# Patient Record
Sex: Male | Born: 1970 | Race: White | Hispanic: No | Marital: Single | Smoking: Never smoker
Health system: Southern US, Community
[De-identification: ages and names within clinical notes are randomized; demographics above are authoritative.]

## PROBLEM LIST (undated history)

## (undated) ENCOUNTER — Ambulatory Visit (HOSPITAL_COMMUNITY): Admission: EM | Payer: PRIVATE HEALTH INSURANCE | Source: Home / Self Care

## (undated) DIAGNOSIS — K219 Gastro-esophageal reflux disease without esophagitis: Secondary | ICD-10-CM

## (undated) HISTORY — PX: INGUINAL HERNIA REPAIR: SUR1180

## (undated) HISTORY — PX: OTHER SURGICAL HISTORY: SHX169

---

## 1999-05-27 ENCOUNTER — Encounter: Payer: Self-pay | Admitting: General Surgery

## 1999-05-27 ENCOUNTER — Inpatient Hospital Stay (HOSPITAL_COMMUNITY): Admission: EM | Admit: 1999-05-27 | Discharge: 1999-05-28 | Payer: Self-pay | Admitting: Emergency Medicine

## 2007-09-05 ENCOUNTER — Ambulatory Visit: Payer: Self-pay | Admitting: General Surgery

## 2009-02-23 ENCOUNTER — Emergency Department (HOSPITAL_BASED_OUTPATIENT_CLINIC_OR_DEPARTMENT_OTHER): Admission: EM | Admit: 2009-02-23 | Discharge: 2009-02-23 | Payer: Self-pay | Admitting: Emergency Medicine

## 2010-12-07 LAB — WOUND CULTURE

## 2020-12-09 ENCOUNTER — Emergency Department (HOSPITAL_COMMUNITY)
Admission: EM | Admit: 2020-12-09 | Discharge: 2020-12-09 | Disposition: A | Discharge status: Home / Self Care | Payer: PRIVATE HEALTH INSURANCE | Attending: Emergency Medicine | Admitting: Emergency Medicine

## 2020-12-09 ENCOUNTER — Encounter (HOSPITAL_COMMUNITY): Payer: Self-pay

## 2020-12-09 ENCOUNTER — Emergency Department (HOSPITAL_COMMUNITY): Payer: PRIVATE HEALTH INSURANCE

## 2020-12-09 ENCOUNTER — Other Ambulatory Visit: Payer: Self-pay

## 2020-12-09 DIAGNOSIS — R7989 Other specified abnormal findings of blood chemistry: Secondary | ICD-10-CM

## 2020-12-09 DIAGNOSIS — R7401 Elevation of levels of liver transaminase levels: Secondary | ICD-10-CM | POA: Diagnosis not present

## 2020-12-09 DIAGNOSIS — R0789 Other chest pain: Secondary | ICD-10-CM | POA: Diagnosis not present

## 2020-12-09 LAB — HEPATIC FUNCTION PANEL
ALT: 50 U/L — ABNORMAL HIGH (ref 0–44)
AST: 88 U/L — ABNORMAL HIGH (ref 15–41)
Albumin: 4.2 g/dL (ref 3.5–5.0)
Alkaline Phosphatase: 57 U/L (ref 38–126)
Bilirubin, Direct: 0.2 mg/dL (ref 0.0–0.2)
Indirect Bilirubin: 0.9 mg/dL (ref 0.3–0.9)
Total Bilirubin: 1.1 mg/dL (ref 0.3–1.2)
Total Protein: 7.8 g/dL (ref 6.5–8.1)

## 2020-12-09 LAB — CBC
HCT: 47.5 % (ref 39.0–52.0)
Hemoglobin: 15.8 g/dL (ref 13.0–17.0)
MCH: 28.5 pg (ref 26.0–34.0)
MCHC: 33.3 g/dL (ref 30.0–36.0)
MCV: 85.6 fL (ref 80.0–100.0)
Platelets: 249 10*3/uL (ref 150–400)
RBC: 5.55 MIL/uL (ref 4.22–5.81)
RDW: 12.4 % (ref 11.5–15.5)
WBC: 12.2 10*3/uL — ABNORMAL HIGH (ref 4.0–10.5)
nRBC: 0 % (ref 0.0–0.2)

## 2020-12-09 LAB — TROPONIN I (HIGH SENSITIVITY): Troponin I (High Sensitivity): 4 ng/L (ref <18)

## 2020-12-09 LAB — BASIC METABOLIC PANEL
Anion gap: 8 (ref 5–15)
BUN: 17 mg/dL (ref 6–20)
CO2: 29 mmol/L (ref 22–32)
Calcium: 9 mg/dL (ref 8.9–10.3)
Chloride: 102 mmol/L (ref 98–111)
Creatinine, Ser: 1.12 mg/dL (ref 0.61–1.24)
GFR, Estimated: >60 mL/min (ref >60)
Glucose, Bld: 100 mg/dL — ABNORMAL HIGH (ref 70–99)
Potassium: 3.9 mmol/L (ref 3.5–5.1)
Sodium: 139 mmol/L (ref 135–145)

## 2020-12-09 LAB — LIPASE, BLOOD: Lipase: 36 U/L (ref 11–51)

## 2020-12-09 MED ORDER — FAMOTIDINE 40 MG PO TABS: 40.0000 mg | ORAL_TABLET | Freq: Every day | ORAL | 0 refills | Status: DC

## 2020-12-09 MED ORDER — ALUM & MAG HYDROXIDE-SIMETH 400-400-40 MG/5ML PO SUSP: 5.0000 mL | Freq: Four times a day (QID) | ORAL | 0 refills | Status: DC | PRN

## 2020-12-09 NOTE — ED Provider Notes (Signed)
[facility] EMERGENCY DEPARTMENT Provider Note   CSN: [id number] Arrival date & time: 12/09/20  1352     History Chief Complaint  Patient presents with  . Chest Pain    Bryan Obrien is a 50 y.o. male presenting for evaluation of chest pain.  Patient states around 63 today he developed a squeezing constant discomfort in his lower chest/upper abdomen.  This occurred about 45 minutes after he had finished eating lunch.  It lasted for approximately 1 hour, before resolving without intervention.  It did not radiate anywhere.  He had associated diaphoresis, but no shortness of breath, nausea, vomiting.  He denies headache, cough, abdominal pain, urinary symptoms, abnormal bowel movements.  He has no medical problems, takes medications daily.  Denies tobacco, alcohol, drug use.  He does not have a primary care doctor, does not get regular blood work.  He reports he recently has had worsened heartburn symptoms, but does not normally present like this.  He had a similar episode of discomfort about 1 week ago in the middle of the night, once again resolved without intervention after a few hours.  He denies family history of early cardiac death.  He denies leg pain or swelling.   HPI     History reviewed. No pertinent past medical history.  There are no problems to display for this patient.   History reviewed. No pertinent surgical history.     History reviewed. No pertinent family history.  Social History   Tobacco Use  . Smoking status: Never Smoker  . Smokeless tobacco: Never Used    Home Medications Prior to Admission medications   Medication Sig Start Date End Date Taking? Authorizing Provider  alum & mag hydroxide-simeth (MAALOX ADVANCED MAX ST) 400-400-40 MG/5ML suspension Take 5 mLs by mouth every 6 (six) hours as needed for indigestion. 12/09/20  Yes Antero Derosia, PA-C  famotidine (PEPCID) 40 MG tablet Take 1 tablet (40 mg total) by mouth daily.  12/09/20  Yes Lauren Modisette, PA-C    Allergies    Patient has no known allergies.  Review of Systems   Review of Systems  Constitutional: Positive for diaphoresis (resolved).  Cardiovascular: Positive for chest pain (resolved).  All other systems reviewed and are negative.   Physical Exam Updated Vital Signs BP (!) 145/80 (BP Location: Right Arm)   Pulse 80   Temp 98.3 F (36.8 C) (Oral)   Resp 18   Ht 5\' 11"  (1.803 m)   Wt 122.5 kg   SpO2 99%   BMI 37.66 kg/m   Physical Exam Vitals and nursing note reviewed.  Constitutional:      General: He is not in acute distress.    Appearance: He is well-developed.     Comments: Resting in the bed in NAD  HENT:     Head: Normocephalic and atraumatic.  Eyes:     Extraocular Movements: Extraocular movements intact.     Conjunctiva/sclera: Conjunctivae normal.     Pupils: Pupils are equal, round, and reactive to light.  Cardiovascular:     Rate and Rhythm: Normal rate and regular rhythm.     Pulses: Normal pulses.  Pulmonary:     Effort: Pulmonary effort is normal. No respiratory distress.     Breath sounds: Normal breath sounds. No wheezing.     Comments: Clear lung sounds in all fields Abdominal:     General: There is no distension.     Palpations: Abdomen is soft. There is no mass.  Tenderness: There is no abdominal tenderness. There is no guarding or rebound.     Comments: No ttp of the abd  Musculoskeletal:        General: Normal range of motion.     Cervical back: Normal range of motion and neck supple.     Right lower leg: No edema.     Left lower leg: No edema.  Skin:    General: Skin is warm and dry.     Capillary Refill: Capillary refill takes less than 2 seconds.  Neurological:     Mental Status: He is alert and oriented to person, place, and time.     ED Results / Procedures / Treatments   Labs (all labs ordered are listed, but only abnormal results are displayed) Labs Reviewed  BASIC METABOLIC  PANEL - Abnormal; Notable for the following components:      Result Value   Glucose, Bld 100 (*)    All other components within normal limits  CBC - Abnormal; Notable for the following components:   WBC 12.2 (*)    All other components within normal limits  HEPATIC FUNCTION PANEL - Abnormal; Notable for the following components:   AST 88 (*)    ALT 50 (*)    All other components within normal limits  LIPASE, BLOOD  TROPONIN I (HIGH SENSITIVITY)  TROPONIN I (HIGH SENSITIVITY)    EKG EKG Interpretation  Date/Time:  Tuesday December 09 2020 13:59:50 EDT Ventricular Rate:  73 PR Interval:  158 QRS Duration: 108 QT Interval:  376 QTC Calculation: 414 R Axis:   27 Text Interpretation: Normal sinus rhythm Cannot rule out Anterior infarct , age undetermined Abnormal ECG No previous ECGs available Confirmed by Tilden Fossa [phone]) on 12/09/2020 2:58:55 PM   Radiology DG Chest 2 View  Result Date: 12/09/2020 CLINICAL DATA:  Chest pain, chills, shortness of breath and chest tightness for 1 day. EXAM: CHEST - 2 VIEW COMPARISON:  None. FINDINGS: Normal sized heart. Clear lungs with normal vascularity. Mild peribronchial thickening. Minimal thoracic spine degenerative changes. IMPRESSION: Mild bronchitic changes. Electronically Signed   By: Beckie Salts M.D.   On: 12/09/2020 15:02    Procedures Procedures   Medications Ordered in ED Medications - No data to display  ED Course  I have reviewed the triage vital signs and the nursing notes.  Pertinent labs & imaging results that were available during my care of the patient were reviewed by me and considered in my medical decision making (see chart for details).    MDM Rules/Calculators/A&P                          Patient presented for evaluation of chest pain.  On my evaluation, chest pain is completely resolved.  History is very atypical, occurred after eating and once last week in the middle the night.  Most likely GI, however as  patient does not have regular PCP checkups and may have undiagnosed comorbidities, consider ACS.  Will obtain labs, chest x-ray, EKG.  Labs interpreted by me, overall reassuring.  Mild elevation in LFTs.  Will have patient recheck with PCP.  Initial troponin negative.  EKG nonischemic.  Chest x-ray viewed interpreted by me, no pneumonia pneumothorax, effusion.  Per radiology, there is mild bronchitic changes, however patient without shortness of breath or wheezing.  No cough.  As such, I do not believe this needs emergent treatment.  Patient declined repeat troponin as he did not  want any further blood draw.  I discussed risks of ACS that could be undiagnosed without repeat troponin, patient states he understands but still does not want repeat troponin.  He is well-appearing without further episodes of chest pain.  As such, will plan for discharge without repeat.  I still have low suspicion for ACS.  Will start patient on medicine for GERD and once again encouraged him to follow-up with a primary care doctor.  Discussed prompt return to the ER with any worsening or recurrent chest pain.  At this time, patient appears safe for discharge.  Return precautions given.  Patient states he understands and agrees to plan.  Final Clinical Impression(s) / ED Diagnoses Final diagnoses:  Atypical chest pain  Elevated LFTs    Rx / DC Orders ED Discharge Orders         Ordered    famotidine (PEPCID) 40 MG tablet  Daily        12/09/20 1740    alum & mag hydroxide-simeth (MAALOX ADVANCED MAX ST) 400-400-40 MG/5ML suspension  Every 6 hours PRN        12/09/20 1740           Fouad Taul, PA-C 12/09/20 1814    Tilden Fossa, MD 12/09/20 2145

## 2020-12-09 NOTE — ED Notes (Signed)
Patient discharge instructions reviewed with the patient. The patient verbalized understanding of instructions. Patient discharged. 

## 2020-12-09 NOTE — Discharge Instructions (Addendum)
Call the number on the back of your insurance card to set up a primary care appointment.  They should reevaluate your symptoms and recheck your liver function. Take Pepcid daily for the next 2 weeks.  Use Maalox as needed if you have breakthrough pain. If your symptoms are persistent or not improving with medication, is important you return to the emergency room for recheck of your symptoms. Return to the emergency room if you develop severe persistent chest pain, shortness of breath, or any new, sudden, or concerning symptoms.

## 2020-12-09 NOTE — ED Triage Notes (Signed)
Pt reports he is here today due to chest pain. Pt reports at he is no longer having chest pain. Pt reports his pain is epigastric and started after eating.Pt denies any cardiac or history. Pt was seen by ems while he was at work but refused to go EKG was normal with ems.

## 2021-01-03 ENCOUNTER — Emergency Department
Admission: EM | Admit: 2021-01-03 | Discharge: 2021-01-03 | Disposition: A | Discharge status: Home / Self Care | Payer: PRIVATE HEALTH INSURANCE | Attending: Emergency Medicine | Admitting: Emergency Medicine

## 2021-01-03 ENCOUNTER — Emergency Department: Payer: PRIVATE HEALTH INSURANCE

## 2021-01-03 ENCOUNTER — Other Ambulatory Visit: Payer: Self-pay

## 2021-01-03 DIAGNOSIS — K8020 Calculus of gallbladder without cholecystitis without obstruction: Secondary | ICD-10-CM | POA: Diagnosis not present

## 2021-01-03 DIAGNOSIS — R1010 Upper abdominal pain, unspecified: Secondary | ICD-10-CM | POA: Diagnosis present

## 2021-01-03 DIAGNOSIS — R1011 Right upper quadrant pain: Secondary | ICD-10-CM

## 2021-01-03 DIAGNOSIS — K8050 Calculus of bile duct without cholangitis or cholecystitis without obstruction: Secondary | ICD-10-CM

## 2021-01-03 LAB — COMPREHENSIVE METABOLIC PANEL
ALT: 19 U/L (ref 0–44)
AST: 24 U/L (ref 15–41)
Albumin: 4.5 g/dL (ref 3.5–5.0)
Alkaline Phosphatase: 45 U/L (ref 38–126)
Anion gap: 9 (ref 5–15)
BUN: 16 mg/dL (ref 6–20)
CO2: 29 mmol/L (ref 22–32)
Calcium: 9.2 mg/dL (ref 8.9–10.3)
Chloride: 101 mmol/L (ref 98–111)
Creatinine, Ser: 0.88 mg/dL (ref 0.61–1.24)
GFR, Estimated: >60 mL/min (ref >60)
Glucose, Bld: 129 mg/dL — ABNORMAL HIGH (ref 70–99)
Potassium: 4 mmol/L (ref 3.5–5.1)
Sodium: 139 mmol/L (ref 135–145)
Total Bilirubin: 0.8 mg/dL (ref 0.3–1.2)
Total Protein: 8.2 g/dL — ABNORMAL HIGH (ref 6.5–8.1)

## 2021-01-03 LAB — URINALYSIS, COMPLETE (UACMP) WITH MICROSCOPIC
Bacteria, UA: NONE SEEN
Bilirubin Urine: NEGATIVE
Glucose, UA: NEGATIVE mg/dL
Hgb urine dipstick: NEGATIVE
Ketones, ur: NEGATIVE mg/dL
Leukocytes,Ua: NEGATIVE
Nitrite: NEGATIVE
Protein, ur: 30 mg/dL — AB
Specific Gravity, Urine: 1.025 (ref 1.005–1.030)
Squamous Epithelial / HPF: NONE SEEN (ref 0–5)
pH: 9 — ABNORMAL HIGH (ref 5.0–8.0)

## 2021-01-03 LAB — CBC
HCT: 51.8 % (ref 39.0–52.0)
Hemoglobin: 17.3 g/dL — ABNORMAL HIGH (ref 13.0–17.0)
MCH: 28.5 pg (ref 26.0–34.0)
MCHC: 33.4 g/dL (ref 30.0–36.0)
MCV: 85.2 fL (ref 80.0–100.0)
Platelets: 275 10*3/uL (ref 150–400)
RBC: 6.08 MIL/uL — ABNORMAL HIGH (ref 4.22–5.81)
RDW: 12.6 % (ref 11.5–15.5)
WBC: 14.4 10*3/uL — ABNORMAL HIGH (ref 4.0–10.5)
nRBC: 0 % (ref 0.0–0.2)

## 2021-01-03 LAB — LIPASE, BLOOD: Lipase: 37 U/L (ref 11–51)

## 2021-01-03 LAB — TROPONIN I (HIGH SENSITIVITY): Troponin I (High Sensitivity): 3 ng/L (ref <18)

## 2021-01-03 MED ORDER — OXYCODONE-ACETAMINOPHEN 5-325 MG PO TABS: 1.0000 | ORAL_TABLET | ORAL | 0 refills | Status: AC | PRN

## 2021-01-03 MED ORDER — ONDANSETRON 4 MG PO TBDP: 4.0000 mg | ORAL_TABLET | Freq: Three times a day (TID) | ORAL | 0 refills | Status: DC | PRN

## 2021-01-03 NOTE — ED Notes (Signed)
Patient back from ultrasound

## 2021-01-03 NOTE — ED Notes (Signed)
EKG to EDP Isaacs in person.  

## 2021-01-03 NOTE — ED Triage Notes (Signed)
Pt in via personal vehicle from home. Medial/L sided upper abd pain. Nausea. Denies vomiting, diarrhea, urination issues, dizziness. Reports diaphoresis. Pt's skin currently visibly dry. Pain feels like pressure per pt. Resp reg/unlabored. Denies changes with diet.

## 2021-01-03 NOTE — ED Notes (Signed)
Attempted for bloodwork at [facility]. Will have 2nd RN try soon.

## 2021-01-03 NOTE — ED Provider Notes (Signed)
[facility] Emergency Department Provider Note   ____________________________________________   Event Date/Time   First MD Initiated Contact with Patient 01/03/21 2039     (approximate)  I have reviewed the triage vital signs and the nursing notes.   HISTORY  Chief Complaint Abdominal Pain (Medial/L sided upper abdominal pain with nausea)    HPI Bryan Obrien is a 50 y.o. male with no significant past medical history who presents to the ED complaining of abdominal pain.  Patient reports that he had sudden onset of upper abdominal pain around 730 this evening while he was at rest.  He describes it as sharp and constant for about 2 hours, after which it has gradually eased off.  It was associated with nausea and one episode of vomiting, however nausea has now also resolved.  He has not had any fevers, dysuria, flank pain, diarrhea, constipation, chest pain, or shortness of breath.  He did have a similar episode of pain a couple of weeks ago, was evaluated at that time in the [facility] ED with unremarkable work-up for chest pain.        History reviewed. No pertinent past medical history.  There are no problems to display for this patient.   History reviewed. No pertinent surgical history.  Prior to Admission medications   Medication Sig Start Date End Date Taking? Authorizing Provider  ondansetron (ZOFRAN ODT) 4 MG disintegrating tablet Take 1 tablet (4 mg total) by mouth every 8 (eight) hours as needed for nausea or vomiting. 01/03/21  Yes Chesley Noon, MD  oxyCODONE-acetaminophen (PERCOCET) 5-325 MG tablet Take 1 tablet by mouth every 4 (four) hours as needed for severe pain. 01/03/21 01/03/22 Yes Chesley Noon, MD  alum & mag hydroxide-simeth (MAALOX ADVANCED MAX ST) 400-400-40 MG/5ML suspension Take 5 mLs by mouth every 6 (six) hours as needed for indigestion. 12/09/20   Caccavale, Sophia, PA-C  famotidine (PEPCID) 40 MG tablet Take 1 tablet (40 mg  total) by mouth daily. 12/09/20   Caccavale, Sophia, PA-C    Allergies Patient has no known allergies.  No family history on file.  Social History Social History   Tobacco Use  . Smoking status: Never Smoker  . Smokeless tobacco: Never Used  Substance Use Topics  . Alcohol use: Not Currently  . Drug use: Not Currently    Review of Systems  Constitutional: No fever/chills Eyes: No visual changes. ENT: No sore throat. Cardiovascular: Denies chest pain. Respiratory: Denies shortness of breath. Gastrointestinal: Positive for abdominal pain, nausea, and vomiting.  No diarrhea.  No constipation. Genitourinary: Negative for dysuria. Musculoskeletal: Negative for back pain. Skin: Negative for rash. Neurological: Negative for headaches, focal weakness or numbness.  ____________________________________________   PHYSICAL EXAM:  VITAL SIGNS: ED Triage Vitals  Enc Vitals Group     BP 01/03/21 1956 (!) 148/92     Pulse Rate 01/03/21 1956 (!) 58     Resp 01/03/21 1956 18     Temp 01/03/21 1956 97.8 F (36.6 C)     Temp Source 01/03/21 1956 Oral     SpO2 01/03/21 1956 100 %     Weight 01/03/21 2007 270 lb (122.5 kg)     Height 01/03/21 2007 5\' 10"  (1.778 m)     Head Circumference --      Peak Flow --      Pain Score 01/03/21 2006 10     Pain Loc --      Pain Edu? --  Excl. in GC? --     Constitutional: Alert and oriented. Eyes: Conjunctivae are normal. Head: Atraumatic. Nose: No congestion/rhinnorhea. Mouth/Throat: Mucous membranes are moist. Neck: Normal ROM Cardiovascular: Normal rate, regular rhythm. Grossly normal heart sounds. Respiratory: Normal respiratory effort.  No retractions. Lungs CTAB. Gastrointestinal: Soft and nontender. No distention. Genitourinary: deferred Musculoskeletal: No lower extremity tenderness nor edema. Neurologic:  Normal speech and language. No gross focal neurologic deficits are appreciated. Skin:  Skin is warm, dry and intact.  No rash noted. Psychiatric: Mood and affect are normal. Speech and behavior are normal.  ____________________________________________   LABS (all labs ordered are listed, but only abnormal results are displayed)  Labs Reviewed  CBC - Abnormal; Notable for the following components:      Result Value   WBC 14.4 (*)    RBC 6.08 (*)    Hemoglobin 17.3 (*)    All other components within normal limits  COMPREHENSIVE METABOLIC PANEL - Abnormal; Notable for the following components:   Glucose, Bld 129 (*)    Total Protein 8.2 (*)    All other components within normal limits  URINALYSIS, COMPLETE (UACMP) WITH MICROSCOPIC - Abnormal; Notable for the following components:   Color, Urine YELLOW (*)    APPearance HAZY (*)    pH 9.0 (*)    Protein, ur 30 (*)    All other components within normal limits  LIPASE, BLOOD  TROPONIN I (HIGH SENSITIVITY)   ____________________________________________  EKG  ED ECG REPORT I, Chesley Noon, the attending physician, personally viewed and interpreted this ECG.   Date: 01/03/2021  EKG Time: 20:02  Rate: 53  Rhythm: sinus bradycardia  Axis: Normal  Intervals:none  ST&T Change: None   PROCEDURES  Procedure(s) performed (including Critical Care):  Procedures   ____________________________________________   INITIAL IMPRESSION / ASSESSMENT AND PLAN / ED COURSE       50 year old male with no significant past medical history presents to the ED complaining of sudden onset epigastric pain earlier this evening while at rest.  This was associated with vomiting but pain has since resolved and he has no tenderness on exam.  Symptoms are concerning for biliary colic and we will further assess with right upper quadrant ultrasound.  I doubt cardiac etiology as EKG shows no ischemic changes and troponin is negative, symptoms atypical for ACS.  LFTs and lipase are pending, we will reassess following additional results.  LFTs and lipase are  unremarkable, UA shows no signs of infection.  Right upper quadrant ultrasound does show cholelithiasis with slightly thickened gallbladder wall.  Patient continues to deny any ongoing pain and has no tenderness in his epigastrium or right upper quadrant at this time.  I suspect his symptoms are due to biliary colic and I doubt cholecystitis at this time.  He is appropriate for discharge home with general surgery follow-up, will be prescribed pain and nausea medication.  He was counseled to return to the ED for new or worsening symptoms, patient agrees with plan.      ____________________________________________   FINAL CLINICAL IMPRESSION(S) / ED DIAGNOSES  Final diagnoses:  RUQ pain  Biliary colic     ED Discharge Orders         Ordered    oxyCODONE-acetaminophen (PERCOCET) 5-325 MG tablet  Every 4 hours PRN        01/03/21 2302    ondansetron (ZOFRAN ODT) 4 MG disintegrating tablet  Every 8 hours PRN        01/03/21 2302  Note:  This document was prepared using Dragon voice recognition software and may include unintentional dictation errors.   Chesley Noon, MD 01/03/21 [phone]

## 2021-01-07 ENCOUNTER — Ambulatory Visit (INDEPENDENT_AMBULATORY_CARE_PROVIDER_SITE_OTHER): Payer: PRIVATE HEALTH INSURANCE | Admitting: Surgery

## 2021-01-07 ENCOUNTER — Encounter: Payer: Self-pay | Admitting: Surgery

## 2021-01-07 ENCOUNTER — Other Ambulatory Visit: Payer: Self-pay

## 2021-01-07 VITALS — BP 129/84 | HR 54 | Temp 98.3°F | Ht 70.0 in | Wt 256.6 lb

## 2021-01-07 DIAGNOSIS — K8020 Calculus of gallbladder without cholecystitis without obstruction: Secondary | ICD-10-CM

## 2021-01-07 NOTE — Consult Note (Signed)
Patient ID: Bryan Obrien, male   DOB: 08/30/1970, 50 y.o.   MRN: [id number]  HPI Bryan Obrien is a 50 y.o. male seen in consultation at the request of Dr. Jessup.  4 days ago he came to the emergency room complaining of abdominal pain.  Pain started suddenly after he had supper.  Pain lasted for about 2 hours or so.  The pain was moderate intensity sharp in nature.  No specific alleviating or aggravating factors.  He also had nausea but no vomiting.  No fevers no chills no biliary obstruction.  No changes in bowel habits.  He did have an ultrasound that I have personally reviewed showing evidence of gallstones.  Normal common bile duct.  No evidence of cholecystitis.  LFTs and CBC was normal except slight elevation of the WBC to 14.4. 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  He does mainly supervising. Did have remote history of an open inguinal hernia repair by Dr. Sankar  Bilaterally.  HPI  History reviewed. No pertinent past medical history.  Past Surgical History:  Procedure Laterality Date  . brok nose    . INGUINAL HERNIA REPAIR    . left arm surgery      Family History  Problem Relation Age of Onset  . Cancer Mother     Social History Social History   Tobacco Use  . Smoking status: Never Smoker  . Smokeless tobacco: Never Used  Substance Use Topics  . Alcohol use: Not Currently  . Drug use: Not Currently    No Known Allergies  Current Outpatient Medications  Medication Sig Dispense Refill  . alum & mag hydroxide-simeth (MAALOX ADVANCED MAX ST) 400-400-40 MG/5ML suspension Take 5 mLs by mouth every 6 (six) hours as needed for indigestion. 355 mL 0  . famotidine (PEPCID) 40 MG tablet Take 1 tablet (40 mg total) by mouth daily. 14 tablet 0  . ondansetron (ZOFRAN ODT) 4 MG disintegrating tablet Take 1 tablet (4 mg total) by mouth every 8 (eight) hours as needed for nausea or vomiting.  12 tablet 0  . oxyCODONE-acetaminophen (PERCOCET) 5-325 MG tablet Take 1 tablet by mouth every 4 (four) hours as needed for severe pain. 12 tablet 0   No current facility-administered medications for this visit.     Review of Systems Full ROS  was asked and was negative except for the information on the HPI  Physical Exam Blood pressure 129/84, pulse (!) 54, temperature 98.3 F (36.8 C), temperature source Oral, height 5' 10" (1.778 m), weight 256 lb 9.6 oz (116.4 kg), SpO2 98 %. CONSTITUTIONAL: NAD EYES: Pupils are equal, round,  Sclera are non-icteric. EARS, NOSE, MOUTH AND THROAT: He is wearing a mask.. Hearing is intact to voice. LYMPH NODES:  Lymph nodes in the neck are normal. RESPIRATORY:  Lungs are clear. There is normal respiratory effort, with equal breath sounds bilaterally, and without pathologic use of accessory muscles. CARDIOVASCULAR: Heart is regular without murmurs, gallops, or rubs. GI: The abdomen is  soft, nontender, and nondistended. There are no palpable masses. There is no hepatosplenomegaly. There are normal bowel sounds in all quadrants. GU: Rectal deferred.   MUSCULOSKELETAL: Normal muscle strength and tone. No cyanosis or edema.   SKIN: Turgor is good and there are no pathologic skin lesions or ulcers. NEUROLOGIC: Motor and sensation is grossly normal. Cranial nerves are grossly intact. PSYCH:  Oriented to person,   place and time. Affect is normal.  Data Reviewed  I have personally reviewed the patient's imaging, laboratory findings and medical records.    Assessment/Plan 50 year old male with biliary colic.  Discussed with the patient in detail about his disease process.  I definitely recommend elective robotic assisted laparoscopic cholecystectomy.I discussed the procedure in detail.  The patient was given Agricultural engineer.  We discussed the risks and benefits of a laparoscopic cholecystectomy and possible cholangiogram including, but not limited to  bleeding, infection, injury to surrounding structures such as the intestine or liver, bile leak, retained gallstones, need to convert to an open procedure, prolonged diarrhea, blood clots such as  DVT, common bile duct injury, anesthesia risks, and possible need for additional procedures.  The likelihood of improvement in symptoms and return to the patient's normal status is good. We discussed the typical post-operative recovery course. A copy of this report was sent to the referring provider    Sterling Big, MD FACS General Surgeon 01/07/2021, 2:15 PM

## 2021-01-07 NOTE — H&P (View-Only) (Signed)
Patient ID: Bryan Obrien, male   DOB: 18-Dec-1970, 50 y.o.   MRN: [id number]  HPI Bryan Obrien is a 50 y.o. male seen in consultation at the request of Dr. Larinda Buttery.  4 days ago he came to the emergency room complaining of abdominal pain.  Pain started suddenly after he had supper.  Pain lasted for about 2 hours or so.  The pain was moderate intensity sharp in nature.  No specific alleviating or aggravating factors.  He also had nausea but no vomiting.  No fevers no chills no biliary obstruction.  No changes in bowel habits.  He did have an ultrasound that I have personally reviewed showing evidence of gallstones.  Normal common bile duct.  No evidence of cholecystitis.  LFTs and CBC was normal except slight elevation of the WBC to 14.4. He works in [employer] and is able to perform more than 6 METS of activity without any shortness of breath or chest pain.  He does mainly supervising. Did have remote history of an open inguinal hernia repair by Dr. Garlan Fair.  HPI  History reviewed. No pertinent past medical history.  Past Surgical History:  Procedure Laterality Date  . brok nose    . INGUINAL HERNIA REPAIR    . left arm surgery      Family History  Problem Relation Age of Onset  . Cancer Mother     Social History Social History   Tobacco Use  . Smoking status: Never Smoker  . Smokeless tobacco: Never Used  Substance Use Topics  . Alcohol use: Not Currently  . Drug use: Not Currently    No Known Allergies  Current Outpatient Medications  Medication Sig Dispense Refill  . alum & mag hydroxide-simeth (MAALOX ADVANCED MAX ST) 400-400-40 MG/5ML suspension Take 5 mLs by mouth every 6 (six) hours as needed for indigestion. 355 mL 0  . famotidine (PEPCID) 40 MG tablet Take 1 tablet (40 mg total) by mouth daily. 14 tablet 0  . ondansetron (ZOFRAN ODT) 4 MG disintegrating tablet Take 1 tablet (4 mg total) by mouth every 8 (eight) hours as needed for nausea or vomiting.  12 tablet 0  . oxyCODONE-acetaminophen (PERCOCET) 5-325 MG tablet Take 1 tablet by mouth every 4 (four) hours as needed for severe pain. 12 tablet 0   No current facility-administered medications for this visit.     Review of Systems Full ROS  was asked and was negative except for the information on the HPI  Physical Exam Blood pressure 129/84, pulse (!) 54, temperature 98.3 F (36.8 C), temperature source Oral, height 5\' 10"  (1.778 m), weight 256 lb 9.6 oz (116.4 kg), SpO2 98 %. CONSTITUTIONAL: NAD EYES: Pupils are equal, round,  Sclera are non-icteric. EARS, NOSE, MOUTH AND THROAT: He is wearing a mask. Hearing is intact to voice. LYMPH NODES:  Lymph nodes in the neck are normal. RESPIRATORY:  Lungs are clear. There is normal respiratory effort, with equal breath sounds bilaterally, and without pathologic use of accessory muscles. CARDIOVASCULAR: Heart is regular without murmurs, gallops, or rubs. GI: The abdomen is  soft, nontender, and nondistended. There are no palpable masses. There is no hepatosplenomegaly. There are normal bowel sounds in all quadrants. GU: Rectal deferred.   MUSCULOSKELETAL: Normal muscle strength and tone. No cyanosis or edema.   SKIN: Turgor is good and there are no pathologic skin lesions or ulcers. NEUROLOGIC: Motor and sensation is grossly normal. Cranial nerves are grossly intact. PSYCH:  Oriented to person,  place and time. Affect is normal.  Data Reviewed  I have personally reviewed the patient's imaging, laboratory findings and medical records.    Assessment/Plan 50 year old male with biliary colic.  Discussed with the patient in detail about his disease process.  I definitely recommend elective robotic assisted laparoscopic cholecystectomy.I discussed the procedure in detail.  The patient was given Agricultural engineer.  We discussed the risks and benefits of a laparoscopic cholecystectomy and possible cholangiogram including, but not limited to  bleeding, infection, injury to surrounding structures such as the intestine or liver, bile leak, retained gallstones, need to convert to an open procedure, prolonged diarrhea, blood clots such as  DVT, common bile duct injury, anesthesia risks, and possible need for additional procedures.  The likelihood of improvement in symptoms and return to the patient's normal status is good. We discussed the typical post-operative recovery course. A copy of this report was sent to the referring provider    Sterling Big, MD FACS General Surgeon 01/07/2021, 2:15 PM

## 2021-01-07 NOTE — Patient Instructions (Signed)
Our surgery scheduler will call you within 24-48 hours to schedule your. surgery. Please have the Blue surgery sheet available when speaking with her  Gallbladder Eating Plan If you have a gallbladder condition, you may have trouble digesting fats. Eating a low-fat diet can help reduce your symptoms, and may be helpful before and after having surgery to remove your gallbladder (cholecystectomy). Your health care provider may recommend that you work with a diet and nutrition specialist (dietitian) to help you reduce the amount of fat in your diet. What are tips for following this plan? General guidelines  Limit your fat intake to less than 30% of your total daily calories. If you eat around 1,800 calories each day, this is less than 60 grams (g) of fat per day.  Fat is an important part of a healthy diet. Eating a low-fat diet can make it hard to maintain a healthy body weight. Ask your dietitian how much fat, calories, and other nutrients you need each day.  Eat small, frequent meals throughout the day instead of three large meals.  Drink at least 8-10 cups of fluid a day. Drink enough fluid to keep your urine clear or pale yellow.  Limit alcohol intake to no more than 1 drink a day for nonpregnant women and 2 drinks a day for men. One drink equals 12 oz of beer, 5 oz of wine, or 1 oz of hard liquor. Reading food labels  Check Nutrition Facts on food labels for the amount of fat per serving. Choose foods with less than 3 grams of fat per serving.   Shopping  Choose nonfat and low-fat healthy foods. Look for the words "nonfat," "low fat," or "fat free."  Avoid buying processed or prepackaged foods. Cooking  Cook using low-fat methods, such as baking, broiling, grilling, or boiling.  Cook with small amounts of healthy fats, such as olive oil, grapeseed oil, canola oil, or sunflower oil. What foods are recommended?  All fresh, frozen, or canned fruits and vegetables.  Whole  grains.  Low-fat or non-fat (skim) milk and yogurt.  Lean meat, skinless poultry, fish, eggs, and beans.  Low-fat protein supplement powders or drinks.  Spices and herbs. What foods are not recommended?  High-fat foods. These include baked goods, fast food, fatty cuts of meat, ice cream, french toast, sweet rolls, pizza, cheese bread, foods covered with butter, creamy sauces, or cheese.  Fried foods. These include french fries, tempura, battered fish, breaded chicken, fried breads, and sweets.  Foods with strong odors.  Foods that cause bloating and gas. Summary  A low-fat diet can be helpful if you have a gallbladder condition, or before and after gallbladder surgery.  Limit your fat intake to less than 30% of your total daily calories. This is about 60 g of fat if you eat 1,800 calories each day.  Eat small, frequent meals throughout the day instead of three large meals. This information is not intended to replace advice given to you by your health care provider. Make sure you discuss any questions you have with your health care provider. Document Revised: 04/03/2020 Document Reviewed: 04/03/2020 Elsevier Patient Education  2021 [employer] Minimally Invasive Cholecystectomy Minimally invasive cholecystectomy is surgery to remove the gallbladder. The gallbladder is a pear-shaped organ that lies beneath the liver on the right side of the body. The gallbladder stores bile, which is a fluid that helps the body digest fats. Cholecystectomy is often done to treat inflammation of the gallbladder (cholecystitis). This condition is usually  caused by a buildup of gallstones (cholelithiasis) in the gallbladder. Gallstones can block the flow of bile, which can result in inflammation and pain. In severe cases, emergency surgery may be required. This procedure is done though small incisions in the abdomen, instead of one large incision. It is also called laparoscopic surgery. A thin scope with  a camera (laparoscope) is inserted through one incision. Then surgical instruments are inserted through the other incisions. In some cases, a minimally invasive surgery may need to be changed to a surgery that is done through a larger incision. This is called open surgery. Tell a health care provider about:  Any allergies you have.  All medicines you are taking, including vitamins, herbs, eye drops, creams, and over-the-counter medicines.  Any problems you or family members have had with anesthetic medicines.  Any blood disorders you have.  Any surgeries you have had.  Any medical conditions you have.  Whether you are pregnant or may be pregnant. What are the risks? Generally, this is a safe procedure. However, problems may occur, including:  Infection.  Bleeding.  Allergic reactions to medicines.  Damage to nearby structures or organs.  A stone remaining in the common bile duct. The common bile duct carries bile from the gallbladder into the small intestine.  A bile leak from the cyst duct that is clipped when your gallbladder is removed. What happens before the procedure? Staying hydrated Follow instructions from your health care provider about hydration, which may include:  Up to 2 hours before the procedure - you may continue to drink clear liquids, such as water, clear fruit juice, black coffee, and plain tea.   Eating and drinking restrictions Follow instructions from your health care provider about eating and drinking, which may include:  8 hours before the procedure - stop eating heavy meals or foods, such as meat, fried foods, or fatty foods.  6 hours before the procedure - stop eating light meals or foods, such as toast or cereal.  6 hours before the procedure - stop drinking milk or drinks that contain milk.  2 hours before the procedure - stop drinking clear liquids. Medicines Ask your health care provider about:  Changing or stopping your regular  medicines. This is especially important if you are taking diabetes medicines or blood thinners.  Taking medicines such as aspirin and ibuprofen. These medicines can thin your blood. Do not take these medicines unless your health care provider tells you to take them.  Taking over-the-counter medicines, vitamins, herbs, and supplements. General instructions  Let your health care provider know if you develop a cold or an infection before surgery.  Plan to have someone take you home from the hospital or clinic.  If you will be going home right after the procedure, plan to have someone with you for 24 hours.  Ask your health care provider: ? How your surgery site will be marked. ? What steps will be taken to help prevent infection. These may include:  Removing hair at the surgery site.  Washing skin with a germ-killing soap.  Taking antibiotic medicine. What happens during the procedure?  An IV will be inserted into one of your veins.  You will be given one or both of the following: ? A medicine to help you relax (sedative). ? A medicine to make you fall asleep (general anesthetic).  A breathing tube will be placed in your mouth.  Your surgeon will make several small incisions in your abdomen.  The laparoscope will be  inserted through one of the small incisions. The camera on the laparoscope will send images to a monitor in the operating room. This lets your surgeon see inside your abdomen.  A gas will be pumped into your abdomen. This will expand your abdomen to give the surgeon more room to perform the surgery.  Other tools that are needed for the procedure will be inserted through the other incisions. The gallbladder will be removed through one of the incisions.  Your common bile duct may be examined. If stones are found in the common bile duct, they may be removed.  After your gallbladder has been removed, the incisions will be closed with stitches (sutures), staples, or skin  glue.  Your incisions may be covered with a bandage (dressing). The procedure may vary among health care providers and hospitals.   What happens after the procedure?  Your blood pressure, heart rate, breathing rate, and blood oxygen level will be monitored until you leave the hospital or clinic.  You will be given medicines as needed to control your pain.  If you were given a sedative during the procedure, it can affect you for several hours. Do not drive or operate machinery until your health care provider says that it is safe. Summary  Minimally invasive cholecystectomy, also called laparoscopic cholecystectomy, is surgery to remove the gallbladder using small incisions.  Tell your health care provider about all the medical conditions you have and all the medicines you are taking for those conditions.  Before the procedure, follow instructions about eating or drinking restrictions and changing or stopping medicines.  If you were given a sedative during the procedure, it can affect you for several hours. Do not drive or operate machinery until your health care provider says that it is safe. This information is not intended to replace advice given to you by your health care provider. Make sure you discuss any questions you have with your health care provider. Document Revised: 05/21/2019 Document Reviewed: 05/21/2019 Elsevier Patient Education  2021 [employer].

## 2021-01-09 ENCOUNTER — Telehealth: Payer: Self-pay | Admitting: Surgery

## 2021-01-09 NOTE — Telephone Encounter (Signed)
Pt has been advised of Pre-Admission date/time, COVID Testing date and Surgery date.  Surgery Date: 01/20/21 Preadmission Testing Date: 01/15/21 (phone 8a-1p) Covid Testing Date: Not Required  Patient has been made aware to call [phone], between 1-3:00pm the day before surgery, to find out what time to arrive for surgery.

## 2021-01-15 ENCOUNTER — Other Ambulatory Visit
Admission: RE | Admit: 2021-01-15 | Discharge: 2021-01-15 | Disposition: A | Discharge status: Home / Self Care | Payer: PRIVATE HEALTH INSURANCE | Source: Ambulatory Visit | Attending: Surgery | Admitting: Surgery

## 2021-01-15 ENCOUNTER — Other Ambulatory Visit: Payer: Self-pay

## 2021-01-15 HISTORY — DX: Gastro-esophageal reflux disease without esophagitis: K21.9

## 2021-01-15 NOTE — Patient Instructions (Signed)
Your procedure is scheduled on: Tuesday 01/20/21.  Report to THE FIRST FLOOR REGISTRATION DESK IN THE MEDICAL MALL ON THE MORNING OF SURGERY FIRST, THEN YOU WILL CHECK IN AT THE SURGERY INFORMATION DESK LOCATED OUTSIDE THE SAME DAY SURGERY DEPARTMENT LOCATED ON 2ND FLOOR MEDICAL MALL ENTRANCE.  To find out your arrival time please call [phone] between 1PM - 3PM on Monday 01/19/21.   Remember: Instructions that are not followed completely may result in serious medical risk, up to and including death, or upon the discretion of your surgeon and anesthesiologist your surgery may need to be rescheduled.     __X__ 1. Do not eat food after midnight the night before your procedure.                 No gum chewing or hard candies. You may drink clear liquids up to 2 hours                 before you are scheduled to arrive for your surgery- DO NOT drink clear                 liquids within 2 hours of the start of your surgery.                 Clear Liquids include:  water, apple juice without pulp, clear carbohydrate                 drink such as Clearfast or Gatorade, Black Coffee or Tea (Do not add                 milk or creamer to coffee or tea).  __X__2.  On the morning of surgery brush your teeth with toothpaste and water, you may rinse your mouth with mouthwash if you wish.  Do not swallow any toothpaste or mouthwash.    __X__ 3.  No Alcohol for 24 hours before or after surgery.  __X__ 4.  Do Not Smoke or use e-cigarettes For 24 Hours Prior to Your Surgery.                 Do not use any chewable tobacco products for at least 6 hours prior to                 surgery.  __X__5.  Notify your doctor if there is any change in your medical condition      (cold, fever, infections).      Do NOT wear jewelry, make-up, hairpins, clips or nail polish. Do NOT wear lotions, powders, or perfumes.  Do NOT shave 48 hours prior to surgery. Men may shave face and neck. Do NOT bring valuables to the  hospital.     [facility] is not responsible for any belongings or valuables.   Contacts, dentures/partials or body piercings may not be worn into surgery. Bring a case for your contacts, glasses or hearing aids, a denture cup will be supplied.    Patients discharged the day of surgery will not be allowed to drive home.     __X__ Take these medicines the morning of surgery with A SIP OF WATER:     1. oxyCODONE-acetaminophen (PERCOCET) if needed  2. ondansetron (ZOFRAN ODT) if needed       __X__ Use CHG Soap/SAGE wipes as directed   __X__ Stop Anti-inflammatories 7 days before surgery such as Advil, Ibuprofen, Motrin, BC or Goodies Powder, Naprosyn, Naproxen, Aleve, Aspirin, Meloxicam. May take Tylenol if needed for pain  or discomfort.   __X__Do not start taking any new herbal supplements or vitamins prior to your procedure.     Wear comfortable clothing (specific to your surgery type) to the hospital.  Plan for stool softeners for home use; pain medications have a tendency to cause constipation. You can also help prevent constipation by eating foods high in fiber such as fruits and vegetables and drinking plenty of fluids as your diet allows.  After surgery, you can prevent lung complications by doing breathing exercises.Take deep breaths and cough every 1-2 hours. Your doctor may order a device called an Incentive Spirometer to help you take deep breaths.  Please call the [facility] at [phone] if you have any questions about these instructions.

## 2021-01-19 ENCOUNTER — Ambulatory Visit: Payer: Self-pay | Admitting: Surgery

## 2021-01-20 ENCOUNTER — Encounter
Admission: RE | Disposition: A | Discharge status: Home / Self Care | Payer: Self-pay | Source: Ambulatory Visit | Attending: Surgery

## 2021-01-20 ENCOUNTER — Ambulatory Visit: Payer: PRIVATE HEALTH INSURANCE | Admitting: Anesthesiology

## 2021-01-20 ENCOUNTER — Other Ambulatory Visit: Payer: Self-pay

## 2021-01-20 ENCOUNTER — Ambulatory Visit
Admission: RE | Admit: 2021-01-20 | Discharge: 2021-01-20 | Disposition: A | Discharge status: Home / Self Care | Payer: PRIVATE HEALTH INSURANCE | Source: Ambulatory Visit | Attending: Surgery | Admitting: Surgery

## 2021-01-20 ENCOUNTER — Encounter: Payer: Self-pay | Admitting: Surgery

## 2021-01-20 DIAGNOSIS — K8050 Calculus of bile duct without cholangitis or cholecystitis without obstruction: Secondary | ICD-10-CM | POA: Diagnosis present

## 2021-01-20 DIAGNOSIS — K8010 Calculus of gallbladder with chronic cholecystitis without obstruction: Secondary | ICD-10-CM | POA: Diagnosis not present

## 2021-01-20 DIAGNOSIS — Z79899 Other long term (current) drug therapy: Secondary | ICD-10-CM | POA: Diagnosis not present

## 2021-01-20 DIAGNOSIS — K8020 Calculus of gallbladder without cholecystitis without obstruction: Secondary | ICD-10-CM

## 2021-01-20 SURGERY — CHOLECYSTECTOMY, ROBOT-ASSISTED, LAPAROSCOPIC
Anesthesia: General

## 2021-01-20 MED ORDER — CHLORHEXIDINE GLUCONATE 0.12 % MT SOLN: 15.0000 mL | Freq: Once | OROMUCOSAL | Status: AC

## 2021-01-20 MED ORDER — PROMETHAZINE HCL 25 MG/ML IJ SOLN: 6.2500 mg | INTRAMUSCULAR | Status: DC | PRN

## 2021-01-20 MED ORDER — CHLORHEXIDINE GLUCONATE 0.12 % MT SOLN
OROMUCOSAL | Status: AC
  Administered 2021-01-20: 15 mL via OROMUCOSAL
  Filled 2021-01-20: qty 15

## 2021-01-20 MED ORDER — CEFAZOLIN SODIUM-DEXTROSE 2-4 GM/100ML-% IV SOLN
INTRAVENOUS | Status: AC
  Filled 2021-01-20: qty 100

## 2021-01-20 MED ORDER — MIDAZOLAM HCL 2 MG/2ML IJ SOLN
INTRAMUSCULAR | Status: DC | PRN
  Administered 2021-01-20: 2 mg via INTRAVENOUS

## 2021-01-20 MED ORDER — ACETAMINOPHEN 500 MG PO TABS
ORAL_TABLET | ORAL | Status: AC
  Administered 2021-01-20: 1000 mg via ORAL
  Filled 2021-01-20: qty 2

## 2021-01-20 MED ORDER — INDOCYANINE GREEN 25 MG IV SOLR
5.0000 mg | Freq: Once | INTRAVENOUS | Status: AC
  Administered 2021-01-20: 5 mg via INTRAVENOUS
  Filled 2021-01-20: qty 10
  Filled 2021-01-20: qty 2

## 2021-01-20 MED ORDER — ONDANSETRON HCL 4 MG/2ML IJ SOLN
INTRAMUSCULAR | Status: AC
  Filled 2021-01-20: qty 2

## 2021-01-20 MED ORDER — CHLORHEXIDINE GLUCONATE CLOTH 2 % EX PADS
6.0000 | MEDICATED_PAD | Freq: Once | CUTANEOUS | Status: AC
  Administered 2021-01-20: 6 via TOPICAL

## 2021-01-20 MED ORDER — LIDOCAINE HCL (CARDIAC) PF 100 MG/5ML IV SOSY
PREFILLED_SYRINGE | INTRAVENOUS | Status: DC | PRN
  Administered 2021-01-20: 40 mg via INTRAVENOUS

## 2021-01-20 MED ORDER — FAMOTIDINE 20 MG PO TABS
ORAL_TABLET | ORAL | Status: AC
  Administered 2021-01-20: 20 mg via ORAL
  Filled 2021-01-20: qty 1

## 2021-01-20 MED ORDER — PROPOFOL 10 MG/ML IV BOLUS
INTRAVENOUS | Status: DC | PRN
  Administered 2021-01-20: 170 mg via INTRAVENOUS
  Administered 2021-01-20: 30 mg via INTRAVENOUS

## 2021-01-20 MED ORDER — FENTANYL CITRATE (PF) 100 MCG/2ML IJ SOLN: 25.0000 ug | INTRAMUSCULAR | Status: DC | PRN

## 2021-01-20 MED ORDER — CEFAZOLIN SODIUM-DEXTROSE 2-4 GM/100ML-% IV SOLN
2.0000 g | INTRAVENOUS | Status: AC
Start: 2021-01-20 — End: 2021-01-20
  Administered 2021-01-20: 2 g via INTRAVENOUS

## 2021-01-20 MED ORDER — FENTANYL CITRATE (PF) 250 MCG/5ML IJ SOLN
INTRAMUSCULAR | Status: AC
  Filled 2021-01-20: qty 5

## 2021-01-20 MED ORDER — HYDROCODONE-ACETAMINOPHEN 5-325 MG PO TABS: 1.0000 | ORAL_TABLET | ORAL | 0 refills | Status: DC | PRN

## 2021-01-20 MED ORDER — CELECOXIB 200 MG PO CAPS
ORAL_CAPSULE | ORAL | Status: AC
  Administered 2021-01-20: 200 mg via ORAL
  Filled 2021-01-20: qty 1

## 2021-01-20 MED ORDER — LACTATED RINGERS IV SOLN: INTRAVENOUS | Status: DC

## 2021-01-20 MED ORDER — SUGAMMADEX SODIUM 200 MG/2ML IV SOLN
INTRAVENOUS | Status: DC | PRN
  Administered 2021-01-20: 200 mg via INTRAVENOUS

## 2021-01-20 MED ORDER — EPHEDRINE 5 MG/ML INJ
INTRAVENOUS | Status: AC
  Filled 2021-01-20: qty 10

## 2021-01-20 MED ORDER — GABAPENTIN 300 MG PO CAPS
ORAL_CAPSULE | ORAL | Status: AC
  Administered 2021-01-20: 300 mg via ORAL
  Filled 2021-01-20: qty 1

## 2021-01-20 MED ORDER — ONDANSETRON HCL 4 MG/2ML IJ SOLN
INTRAMUSCULAR | Status: DC | PRN
  Administered 2021-01-20: 4 mg via INTRAVENOUS

## 2021-01-20 MED ORDER — CHLORHEXIDINE GLUCONATE CLOTH 2 % EX PADS: 6.0000 | MEDICATED_PAD | Freq: Once | CUTANEOUS | Status: DC

## 2021-01-20 MED ORDER — FAMOTIDINE 20 MG PO TABS: 20.0000 mg | ORAL_TABLET | Freq: Once | ORAL | Status: AC

## 2021-01-20 MED ORDER — BUPIVACAINE LIPOSOME 1.3 % IJ SUSP
INTRAMUSCULAR | Status: AC
  Filled 2021-01-20: qty 20

## 2021-01-20 MED ORDER — BUPIVACAINE-EPINEPHRINE (PF) 0.25% -1:200000 IJ SOLN
INTRAMUSCULAR | Status: DC | PRN
  Administered 2021-01-20: 30 mL via PERINEURAL

## 2021-01-20 MED ORDER — ACETAMINOPHEN 500 MG PO TABS: 1000.0000 mg | ORAL_TABLET | ORAL | Status: AC

## 2021-01-20 MED ORDER — FENTANYL CITRATE (PF) 100 MCG/2ML IJ SOLN
INTRAMUSCULAR | Status: DC | PRN
  Administered 2021-01-20: 100 ug via INTRAVENOUS

## 2021-01-20 MED ORDER — PROPOFOL 10 MG/ML IV BOLUS
INTRAVENOUS | Status: AC
  Filled 2021-01-20: qty 20

## 2021-01-20 MED ORDER — DEXAMETHASONE SODIUM PHOSPHATE 10 MG/ML IJ SOLN
INTRAMUSCULAR | Status: DC | PRN
  Administered 2021-01-20: 10 mg via INTRAVENOUS

## 2021-01-20 MED ORDER — BUPIVACAINE LIPOSOME 1.3 % IJ SUSP
INTRAMUSCULAR | Status: DC | PRN
  Administered 2021-01-20: 20 mL

## 2021-01-20 MED ORDER — ROCURONIUM BROMIDE 10 MG/ML (PF) SYRINGE
PREFILLED_SYRINGE | INTRAVENOUS | Status: AC
  Filled 2021-01-20: qty 10

## 2021-01-20 MED ORDER — CELECOXIB 200 MG PO CAPS: 200.0000 mg | ORAL_CAPSULE | ORAL | Status: AC

## 2021-01-20 MED ORDER — MIDAZOLAM HCL 2 MG/2ML IJ SOLN
INTRAMUSCULAR | Status: AC
  Filled 2021-01-20: qty 2

## 2021-01-20 MED ORDER — BUPIVACAINE-EPINEPHRINE (PF) 0.25% -1:200000 IJ SOLN
INTRAMUSCULAR | Status: AC
  Filled 2021-01-20: qty 30

## 2021-01-20 MED ORDER — ROCURONIUM BROMIDE 100 MG/10ML IV SOLN
INTRAVENOUS | Status: DC | PRN
  Administered 2021-01-20: 50 mg via INTRAVENOUS
  Administered 2021-01-20: 20 mg via INTRAVENOUS

## 2021-01-20 MED ORDER — ORAL CARE MOUTH RINSE: 15.0000 mL | Freq: Once | OROMUCOSAL | Status: AC

## 2021-01-20 MED ORDER — LIDOCAINE HCL (PF) 2 % IJ SOLN
INTRAMUSCULAR | Status: AC
  Filled 2021-01-20: qty 2

## 2021-01-20 MED ORDER — GABAPENTIN 300 MG PO CAPS: 300.0000 mg | ORAL_CAPSULE | ORAL | Status: AC

## 2021-01-20 MED ORDER — EPHEDRINE SULFATE 50 MG/ML IJ SOLN
INTRAMUSCULAR | Status: DC | PRN
  Administered 2021-01-20: 5 mg via INTRAVENOUS
  Administered 2021-01-20: 10 mg via INTRAVENOUS

## 2021-01-20 MED ORDER — DEXAMETHASONE SODIUM PHOSPHATE 10 MG/ML IJ SOLN
INTRAMUSCULAR | Status: AC
  Filled 2021-01-20: qty 1

## 2021-01-20 SURGICAL SUPPLY — 52 items
ADH SKN CLS APL DERMABOND .7 (GAUZE/BANDAGES/DRESSINGS) ×1
APL PRP STRL LF DISP 70% ISPRP (MISCELLANEOUS) ×1
BAG SPEC RTRVL LRG 6X4 10 (ENDOMECHANICALS) ×1
CANISTER SUCT 1200ML W/VALVE (MISCELLANEOUS) ×2 IMPLANT
CANNULA REDUC XI 12-8 STAPL (CANNULA) ×2
CANNULA REDUCER 12-8 DVNC XI (CANNULA) ×1 IMPLANT
CHLORAPREP W/TINT 26 (MISCELLANEOUS) ×2 IMPLANT
CLIP VESOLOCK MED LG 6/CT (CLIP) ×2 IMPLANT
CONT SPEC 4OZ CLIKSEAL STRL BL (MISCELLANEOUS) ×1 IMPLANT
COVER WAND RF STERILE (DRAPES) ×2 IMPLANT
DECANTER SPIKE VIAL GLASS SM (MISCELLANEOUS) ×2 IMPLANT
DEFOGGER SCOPE WARMER CLEARIFY (MISCELLANEOUS) ×2 IMPLANT
DERMABOND ADVANCED (GAUZE/BANDAGES/DRESSINGS) ×1
DERMABOND ADVANCED .7 DNX12 (GAUZE/BANDAGES/DRESSINGS) ×1 IMPLANT
DRAPE ARM DVNC X/XI (DISPOSABLE) ×4 IMPLANT
DRAPE COLUMN DVNC XI (DISPOSABLE) ×1 IMPLANT
DRAPE DA VINCI XI ARM (DISPOSABLE) ×8
DRAPE DA VINCI XI COLUMN (DISPOSABLE) ×2
ELECT CAUTERY BLADE 6.4 (BLADE) ×2 IMPLANT
ELECT REM PT RETURN 9FT ADLT (ELECTROSURGICAL) ×2
ELECTRODE REM PT RTRN 9FT ADLT (ELECTROSURGICAL) ×1 IMPLANT
GLOVE SURG ENC MOIS LTX SZ7 (GLOVE) ×4 IMPLANT
GOWN STRL REUS W/ TWL LRG LVL3 (GOWN DISPOSABLE) ×4 IMPLANT
GOWN STRL REUS W/TWL LRG LVL3 (GOWN DISPOSABLE) ×8
IRRIGATION STRYKERFLOW (MISCELLANEOUS) IMPLANT
IRRIGATOR STRYKERFLOW (MISCELLANEOUS)
IV NS 1000ML (IV SOLUTION)
IV NS 1000ML BAXH (IV SOLUTION) IMPLANT
KIT PINK PAD W/HEAD ARE REST (MISCELLANEOUS) ×2
KIT PINK PAD W/HEAD ARM REST (MISCELLANEOUS) ×1 IMPLANT
LABEL OR SOLS (LABEL) ×2 IMPLANT
MANIFOLD NEPTUNE II (INSTRUMENTS) ×2 IMPLANT
NEEDLE HYPO 22GX1.5 SAFETY (NEEDLE) ×2 IMPLANT
NS IRRIG 500ML POUR BTL (IV SOLUTION) ×2 IMPLANT
OBTURATOR OPTICAL STANDARD 8MM (TROCAR) ×2
OBTURATOR OPTICAL STND 8 DVNC (TROCAR) ×1
OBTURATOR OPTICALSTD 8 DVNC (TROCAR) ×1 IMPLANT
PACK LAP CHOLECYSTECTOMY (MISCELLANEOUS) ×2 IMPLANT
PENCIL ELECTRO HAND CTR (MISCELLANEOUS) ×2 IMPLANT
POUCH SPECIMEN RETRIEVAL 10MM (ENDOMECHANICALS) ×2 IMPLANT
SEAL CANN UNIV 5-8 DVNC XI (MISCELLANEOUS) ×3 IMPLANT
SEAL XI 5MM-8MM UNIVERSAL (MISCELLANEOUS) ×6
SET TUBE SMOKE EVAC HIGH FLOW (TUBING) ×2 IMPLANT
SOLUTION ELECTROLUBE (MISCELLANEOUS) ×2 IMPLANT
SPONGE LAP 18X18 RF (DISPOSABLE) ×2 IMPLANT
SPONGE LAP 4X18 RFD (DISPOSABLE) IMPLANT
STAPLER CANNULA SEAL DVNC XI (STAPLE) ×1 IMPLANT
STAPLER CANNULA SEAL XI (STAPLE) ×2
SUT MNCRL AB 4-0 PS2 18 (SUTURE) ×2 IMPLANT
SUT VICRYL 0 AB UR-6 (SUTURE) ×4 IMPLANT
TAPE TRANSPORE STRL 2 31045 (GAUZE/BANDAGES/DRESSINGS) ×2 IMPLANT
TROCAR BALLN GELPORT 12X130M (ENDOMECHANICALS) ×2 IMPLANT

## 2021-01-20 NOTE — Anesthesia Postprocedure Evaluation (Signed)
Anesthesia Post Note  Patient: Bryan Obrien  Procedure(s) Performed: XI ROBOTIC ASSISTED LAPAROSCOPIC CHOLECYSTECTOMY (N/A )  Patient location during evaluation: PACU Anesthesia Type: General Level of consciousness: awake and alert and oriented Pain management: pain level controlled Vital Signs Assessment: post-procedure vital signs reviewed and stable Respiratory status: spontaneous breathing, nonlabored ventilation and respiratory function stable Cardiovascular status: blood pressure returned to baseline and stable Postop Assessment: no signs of nausea or vomiting Anesthetic complications: no   No complications documented.   Last Vitals:  Vitals:   01/20/21 1300 01/20/21 1316  BP: (!) 145/77 134/79  Pulse: 67 62  Resp: 19 18  Temp: 36.7 C (!) 35.9 C  SpO2: 100% 98%    Last Pain:  Vitals:   01/20/21 1316  TempSrc: Temporal  PainSc: 0-No pain                 Toneshia Coello

## 2021-01-20 NOTE — Anesthesia Preprocedure Evaluation (Signed)
Anesthesia Evaluation  Patient identified by MRN, date of birth, ID band Patient awake    Reviewed: Allergy & Precautions, H&P , NPO status , Patient's Chart, lab work & pertinent test results, reviewed documented beta blocker date and time   History of Anesthesia Complications Negative for: history of anesthetic complications  Airway Mallampati: I  TM Distance: >3 FB Neck ROM: full    Dental  (+) Dental Advidsory Given, Caps, Teeth Intact   Pulmonary neg pulmonary ROS,    Pulmonary exam normal breath sounds clear to auscultation       Cardiovascular Exercise Tolerance: Good negative cardio ROS Normal cardiovascular exam Rhythm:regular Rate:Normal     Neuro/Psych negative neurological ROS  negative psych ROS   GI/Hepatic negative GI ROS, Neg liver ROS,   Endo/Other  negative endocrine ROS  Renal/GU negative Renal ROS  negative genitourinary   Musculoskeletal   Abdominal   Peds  Hematology negative hematology ROS (+)   Anesthesia Other Findings Past Medical History: No date: GERD (gastroesophageal reflux disease) Obesity  Reproductive/Obstetrics negative OB ROS                             Anesthesia Physical Anesthesia Plan  ASA: II  Anesthesia Plan: General   Post-op Pain Management:    Induction: Intravenous  PONV Risk Score and Plan: 2 and Ondansetron, Dexamethasone, Midazolam, Promethazine and Treatment may vary due to age or medical condition  Airway Management Planned: Oral ETT  Additional Equipment:   Intra-op Plan:   Post-operative Plan: Extubation in OR  Informed Consent: I have reviewed the patients History and Physical, chart, labs and discussed the procedure including the risks, benefits and alternatives for the proposed anesthesia with the patient or authorized representative who has indicated his/her understanding and acceptance.     Dental Advisory  Given  Plan Discussed with: Anesthesiologist, CRNA and Surgeon  Anesthesia Plan Comments:         Anesthesia Quick Evaluation

## 2021-01-20 NOTE — Discharge Instructions (Addendum)
Information for Discharge Teaching: EXPAREL (bupivacaine liposome injectable suspension)   Your surgeon or anesthesiologist gave you EXPAREL(bupivacaine) to help control your pain after surgery.   EXPAREL is a local anesthetic that provides pain relief by numbing the tissue around the surgical site.  EXPAREL is designed to release pain medication over time and can control pain for up to 72 hours.  Depending on how you respond to EXPAREL, you may require less pain medication during your recovery.  Possible side effects:  Temporary loss of sensation or ability to move in the area where bupivacaine was injected.  Nausea, vomiting, constipation  Rarely, numbness and tingling in your mouth or lips, lightheadedness, or anxiety may occur.  Call your doctor right away if you think you may be experiencing any of these sensations, or if you have other questions regarding possible side effects.  Follow all other discharge instructions given to you by your surgeon or nurse. Eat a healthy diet and drink plenty of water or other fluids.  If you return to the hospital for any reason within 96 hours following the administration of EXPAREL, it is important for health care providers to know that you have received this anesthetic. A teal colored band has been placed on your arm with the date, time and amount of EXPAREL you have received in order to alert and inform your health care providers. Please leave this armband in place for the full 96 hours following administration, and then you may remove the band.  Laparoscopic Cholecystectomy, Care After   These instructions give you information on caring for yourself after your procedure. Your doctor may also give you more specific instructions. Call your doctor if you have any problems or questions after your procedure.  HOME CARE  Change your bandages (dressings) as told by your doctor.  Keep the wound dry and clean. Wash the wound gently with soap and  water. Pat the wound dry with a clean towel.  Do not take baths, swim, or use hot tubs for 2 weeks, or as told by your doctor.  Only take medicine as told by your doctor.  Eat a normal diet as told by your doctor.  Do not lift anything heavier than 10 pounds (4.5 kg) until your doctor says it is okay.  Do not play contact sports for 1 week, or as told by your doctor. GET HELP IF:  Your wound is red, puffy (swollen), or painful.  You have yellowish-white fluid (pus) coming from the wound.  You have fluid draining from the wound for more than 1 day.  You have a bad smell coming from the wound.  Your wound breaks open. GET HELP RIGHT AWAY IF:  You have trouble breathing.  You have chest pain.  You have a fever >101  You have pain in the shoulders (shoulder strap areas) that is getting worse.  You feel dizzy or pass out (faint).  You have severe belly (abdominal) pain.  You feel sick to your stomach (nauseous) or throw up (vomit) for more than 1 day. AMBULATORY SURGERY  DISCHARGE INSTRUCTIONS   1) The drugs that you were given will stay in your system until tomorrow so for the next 24 hours you should not:  A) Drive an automobile B) Make any legal decisions C) Drink any alcoholic beverage   2) You may resume regular meals tomorrow.  Today it is better to start with liquids and gradually work up to solid foods.  You may eat anything you prefer, but  it is better to start with liquids, then soup and crackers, and gradually work up to solid foods.   3) Please notify your doctor immediately if you have any unusual bleeding, trouble breathing, redness and pain at the surgery site, drainage, fever, or pain not relieved by medication.         Please contact your physician with any problems or Same Day Surgery at [phone], Monday through Friday 6 am to 4 pm, or [place] [facility] number at [phone].

## 2021-01-20 NOTE — Interval H&P Note (Signed)
History and Physical Interval Note:  01/20/2021 10:08 AM  Bryan Obrien  has presented today for surgery, with the diagnosis of Cholelithiasis / Cholecystitis.  The various methods of treatment have been discussed with the patient and family. After consideration of risks, benefits and other options for treatment, the patient has consented to  Procedure(s): XI ROBOTIC ASSISTED LAPAROSCOPIC CHOLECYSTECTOMY (N/A) as a surgical intervention.  The patient's history has been reviewed, patient examined, no change in status, stable for surgery.  I have reviewed the patient's chart and labs.  Questions were answered to the patient's satisfaction.     Janal Haak F Akaylah Lalley

## 2021-01-20 NOTE — Anesthesia Procedure Notes (Signed)
Procedure Name: Intubation Date/Time: 01/20/2021 10:48 AM Performed by: Rona Ravens, CRNA Pre-anesthesia Checklist: Patient identified, Patient being monitored, Timeout performed, Emergency Drugs available and Suction available Patient Re-evaluated:Patient Re-evaluated prior to induction Oxygen Delivery Method: Circle system utilized Preoxygenation: Pre-oxygenation with 100% oxygen Induction Type: IV induction Ventilation: Mask ventilation without difficulty Laryngoscope Size: Mac, 3 and 4 Grade View: Grade II Tube type: Oral Tube size: 7.5 mm Number of attempts: 1 Airway Equipment and Method: Stylet Placement Confirmation: ETT inserted through vocal cords under direct vision,  positive ETCO2 and breath sounds checked- equal and bilateral Secured at: 22 cm Tube secured with: Tape Dental Injury: Teeth and Oropharynx as per pre-operative assessment

## 2021-01-20 NOTE — Transfer of Care (Signed)
Immediate Anesthesia Transfer of Care Note  Patient: Bryan Obrien  Procedure(s) Performed: XI ROBOTIC ASSISTED LAPAROSCOPIC CHOLECYSTECTOMY (N/A )  Patient Location: PACU  Anesthesia Type:General  Level of Consciousness: awake and alert   Airway & Oxygen Therapy: Patient Spontanous Breathing and Patient connected to face mask oxygen  Post-op Assessment: Report given to RN and Post -op Vital signs reviewed and stable  Post vital signs: Reviewed and stable  Last Vitals:  Vitals Value Taken Time  BP 141/75 01/20/21 1203  Temp 36.5 C 01/20/21 1203  Pulse 71 01/20/21 1206  Resp 22 01/20/21 1206  SpO2 100 % 01/20/21 1206  Vitals shown include unvalidated device data.  Last Pain:  Vitals:   01/20/21 0900  TempSrc: Temporal  PainSc: 0-No pain         Complications: No complications documented.

## 2021-01-20 NOTE — Op Note (Signed)
Robotic assisted laparoscopic Cholecystectomy  Pre-operative Diagnosis: biliary colic  Post-operative Diagnosis: same  Procedure:  Robotic assisted laparoscopic Cholecystectomy  Surgeon: Sterling Big, MD FACS  Anesthesia: Gen. with endotracheal tube  Findings: Chronic mild Cholecystitis   Estimated Blood Loss: 5 cc       Specimens: Gallbladder           Complications: none   Procedure Details  The patient was seen again in the Holding Room. The benefits, complications, treatment options, and expected outcomes were discussed with the patient. The risks of bleeding, infection, recurrence of symptoms, failure to resolve symptoms, bile duct damage, bile duct leak, retained common bile duct stone, bowel injury, any of which could require further surgery and/or ERCP, stent, or papillotomy were reviewed with the patient. The likelihood of improving the patient's symptoms with return to their baseline status is good.  The patient and/or family concurred with the proposed plan, giving informed consent.  The patient was taken to Operating Room, identified  and the procedure verified as Laparoscopic Cholecystectomy.  A Time Out was held and the above information confirmed.  Prior to the induction of general anesthesia, antibiotic prophylaxis was administered. VTE prophylaxis was in place. General endotracheal anesthesia was then administered and tolerated well. After the induction, the abdomen was prepped with Chloraprep and draped in the sterile fashion. The patient was positioned in the supine position.  Cut down technique was used to enter the abdominal cavity and a Hasson trochar was placed after two vicryl stitches were anchored to the fascia. Pneumoperitoneum was then created with CO2 and tolerated well without any adverse changes in the patient's vital signs.  Three 8-mm ports were placed under direct vision. All skin incisions  were infiltrated with a local anesthetic agent before making the  incision and placing the trocars.   The patient was positioned  in reverse Trendelenburg, robot was brought to the surgical field and docked in the standard fashion.  We made sure all the instrumentation was kept indirect view at all times and that there were no collision between the arms. I scrubbed out and went to the console.  The gallbladder was identified, the fundus grasped and retracted cephalad. Adhesions were lysed bluntly. The infundibulum was grasped and retracted laterally, exposing the peritoneum overlying the triangle of Calot. This was then divided and exposed in a blunt fashion. An extended critical view of the cystic duct and cystic artery was obtained.  The cystic duct was clearly identified and bluntly dissected.   Artery and duct were double clipped and divided. Using ICG cholangiography we visualize the cystic duct and CBD, no evidence of bile injuries observed. The gallbladder was taken from the gallbladder fossa in a retrograde fashion with the electrocautery.  Hemostasis was achieved with the electrocautery. nspection of the right upper quadrant was performed. No bleeding, bile duct injury or leak, or bowel injury was noted. Robotic instruments and robotic arms were undocked in the standard fashion.  I scrubbed back in.  The gallbladder was removed and placed in an Endocatch bag.   Pneumoperitoneum was released.  The periumbilical port site was closed with interrumpted 0 Vicryl sutures. 4-0 subcuticular Monocryl was used to close the skin. Dermabond was  applied.  The patient was then extubated and brought to the recovery room in stable condition. Sponge, lap, and needle counts were correct at closure and at the conclusion of the case.               Bryan Obrien [place],  MD, FACS

## 2021-01-21 LAB — SURGICAL PATHOLOGY

## 2021-05-20 ENCOUNTER — Ambulatory Visit: Payer: Self-pay | Admitting: Family Medicine

## 2022-07-07 IMAGING — CR DG CHEST 2V
2 series · 2 of 2 positions shown · non-contrast
Comparison: None.

CLINICAL DATA: Chest pain, chills, shortness of breath and chest
tightness for 1 day.

EXAM:
CHEST - 2 VIEW

[chest pa]
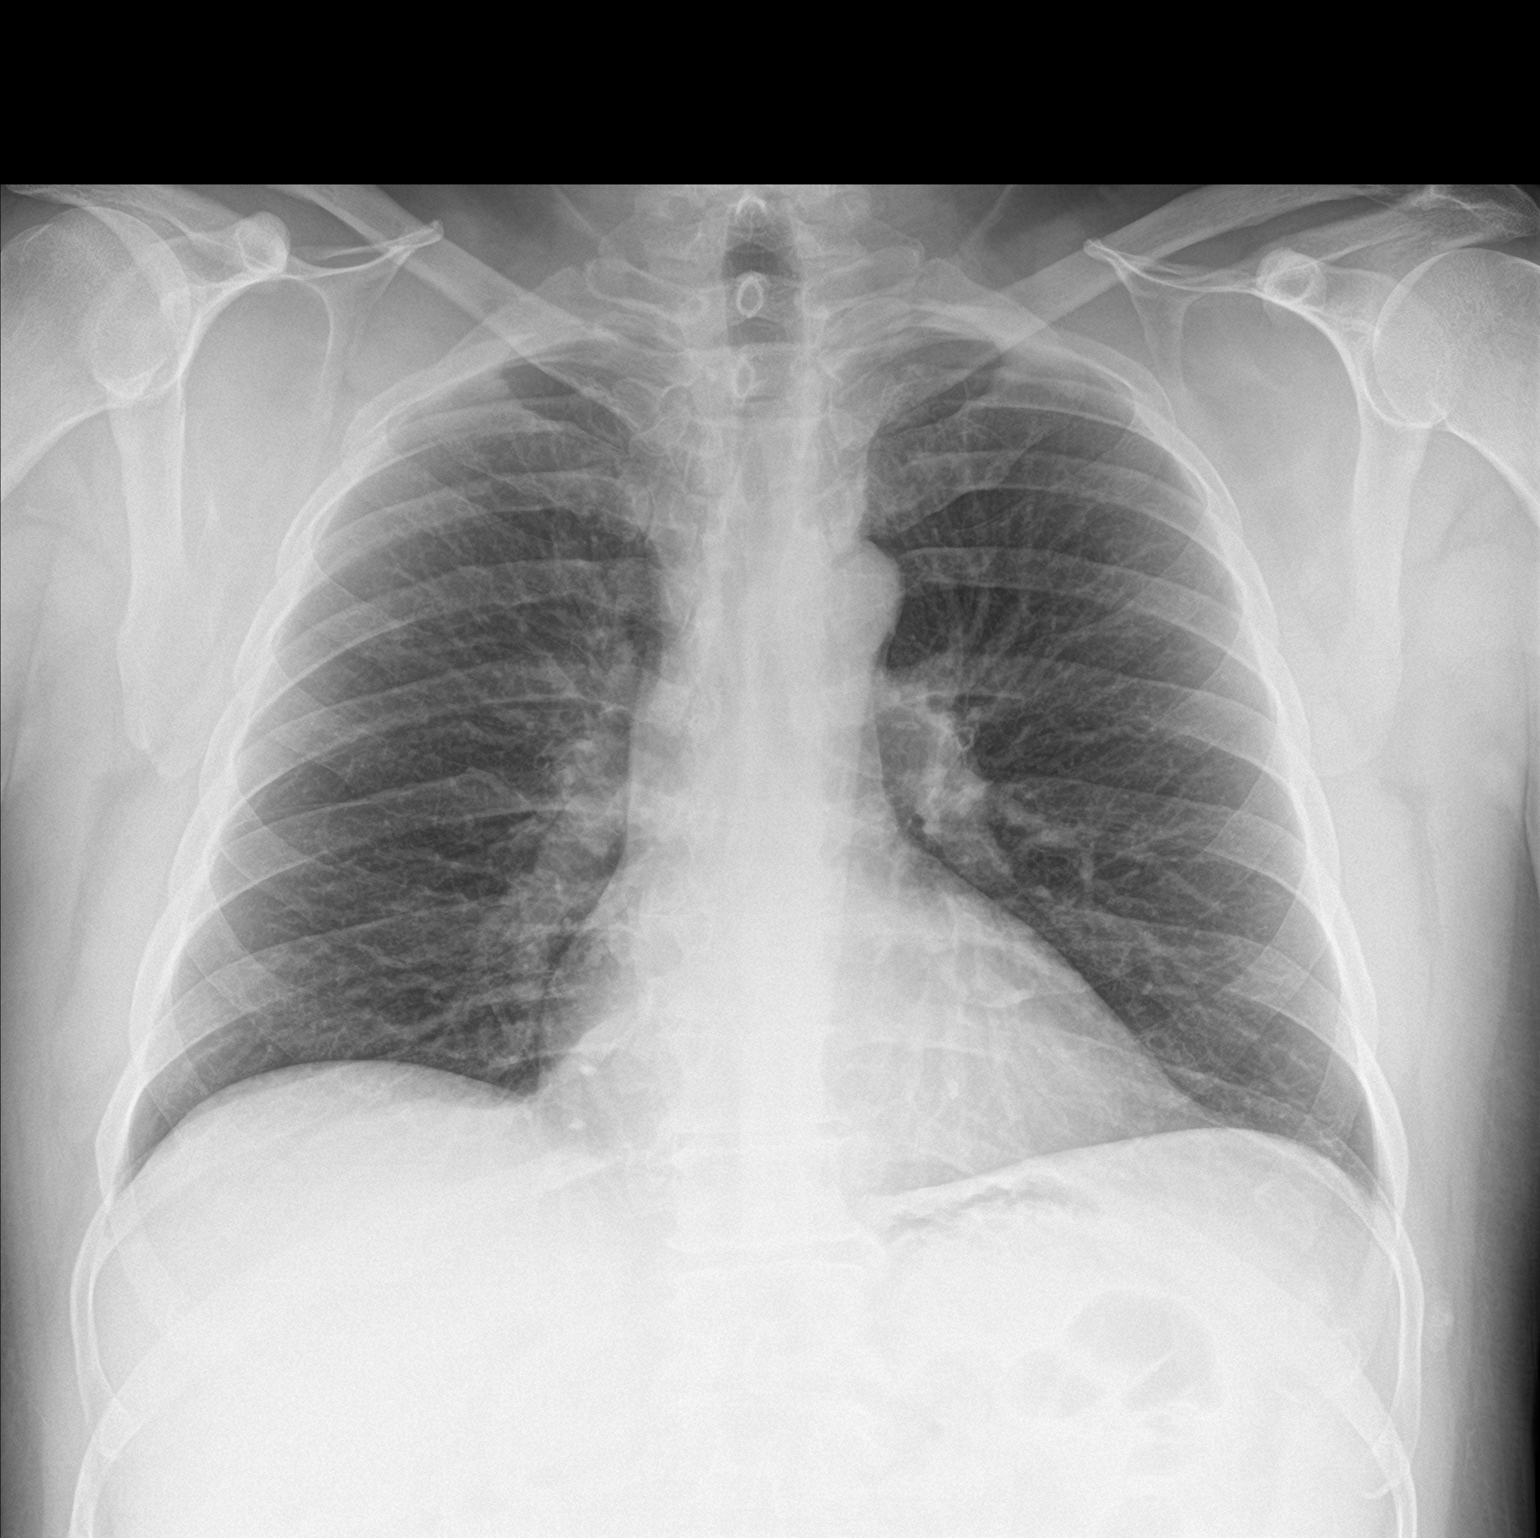

[chest lat]
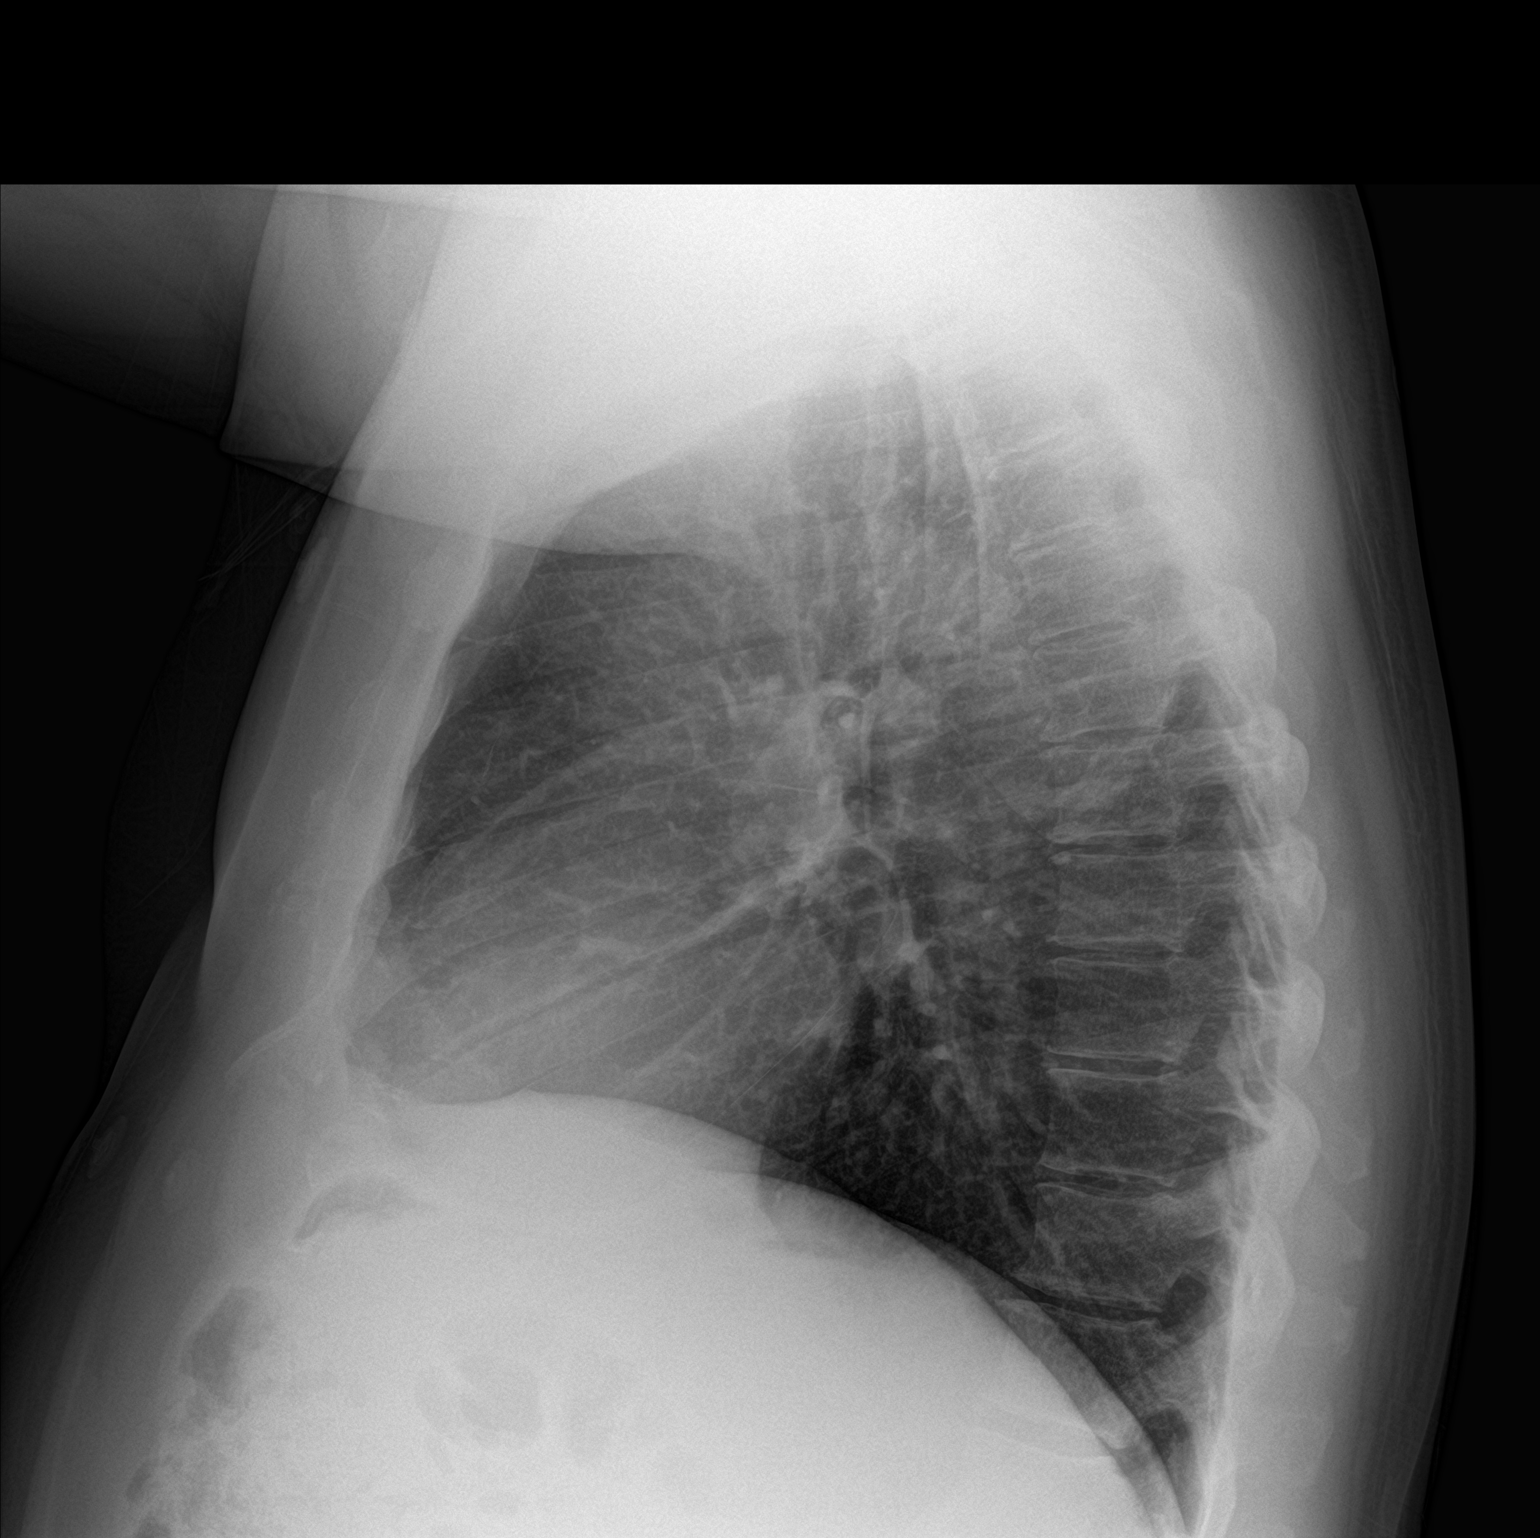

[2 of 2 positions shown; findings below may reference images not displayed]

FINDINGS: Normal sized heart. Clear lungs with normal vascularity. Mild
peribronchial thickening. Minimal thoracic spine degenerative
changes.
IMPRESSION: Mild bronchitic changes.

## 2022-08-01 IMAGING — US US ABDOMEN LIMITED RUQ/ASCITES
1 series · 14 of 25 positions shown · non-contrast
Comparison: None.

CLINICAL DATA: Upper abdominal pain.

EXAM:
ULTRASOUND ABDOMEN LIMITED RIGHT UPPER QUADRANT

[Series 1: us abdomen limited ruq (liver/gb) · 14 of 32 slices shown]
[im 1/32]
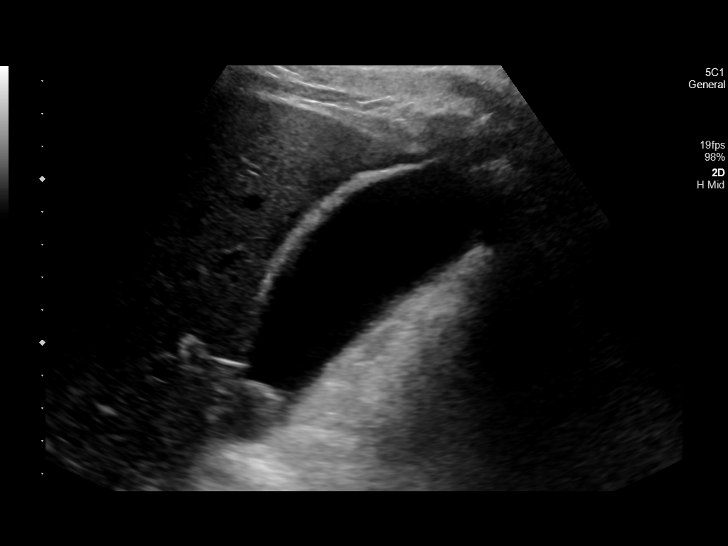
[im 3/32]
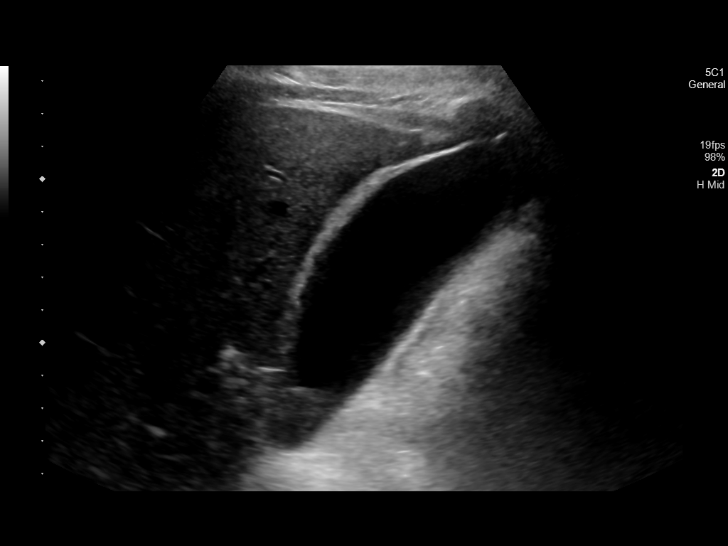
[im 6/32]
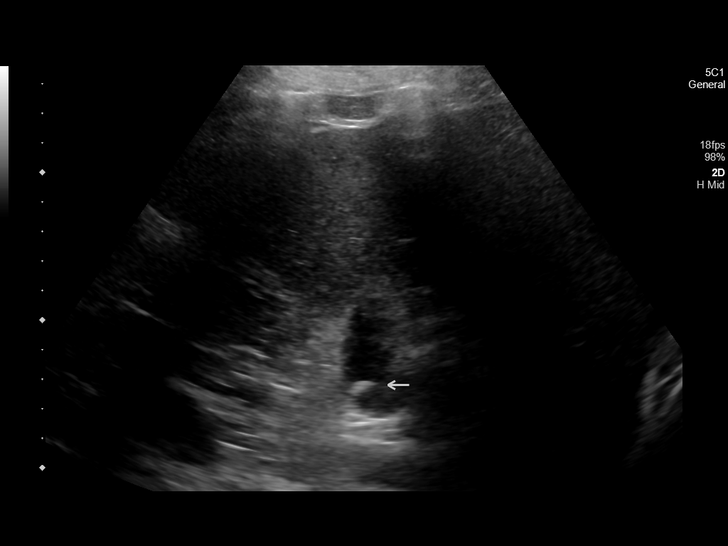
[im 8/32]
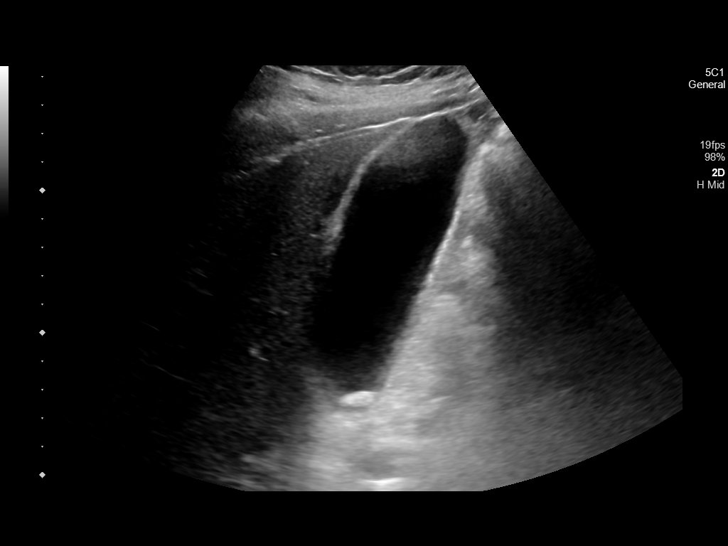
[im 11/32]
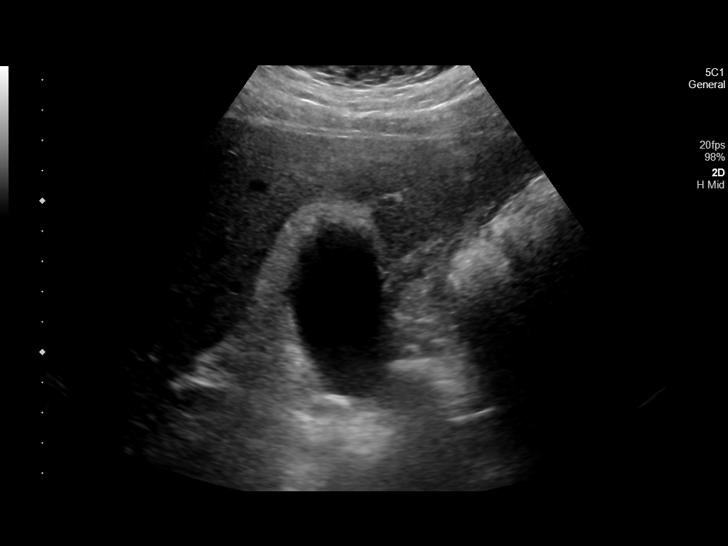
[im 12/32]
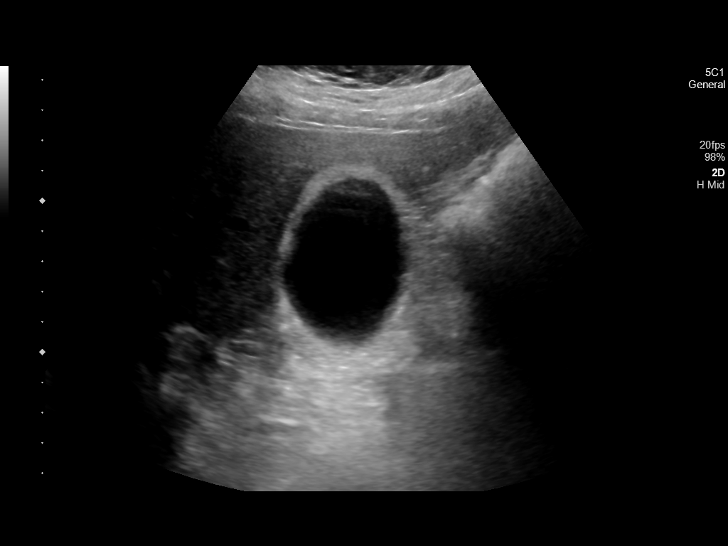
[im 15/32]
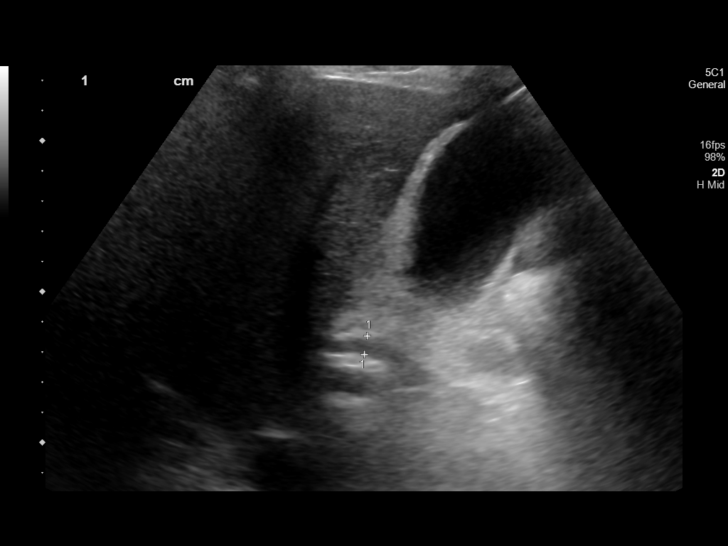
[im 17/32]
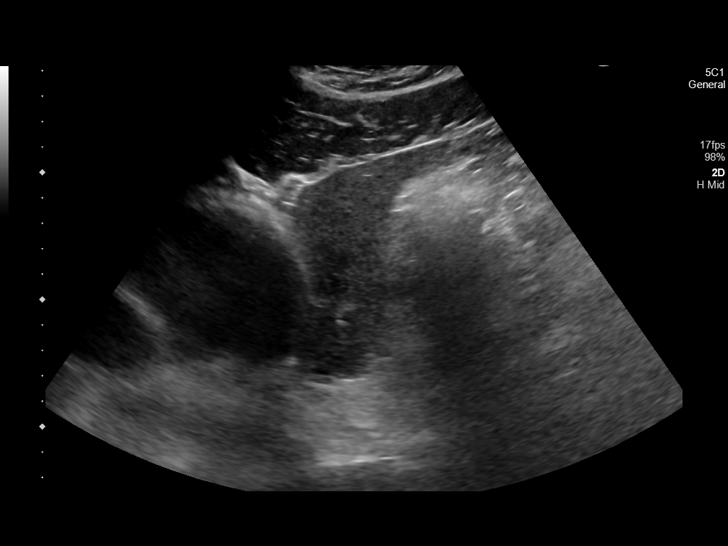
[im 20/32]
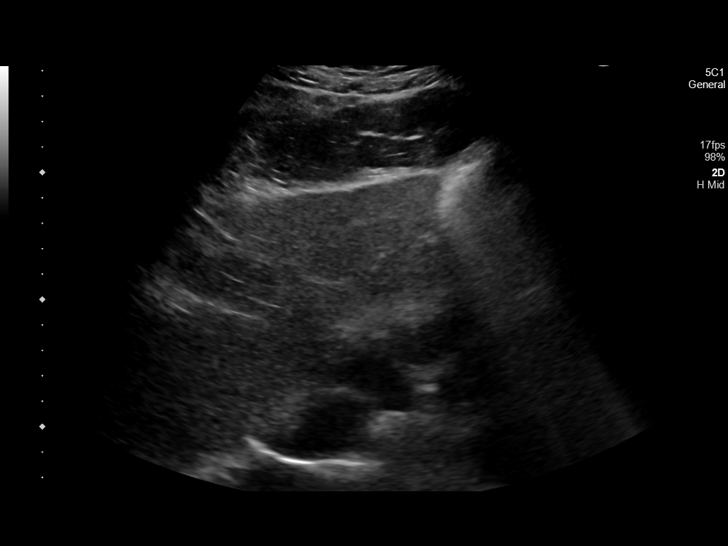
[im 21/32]
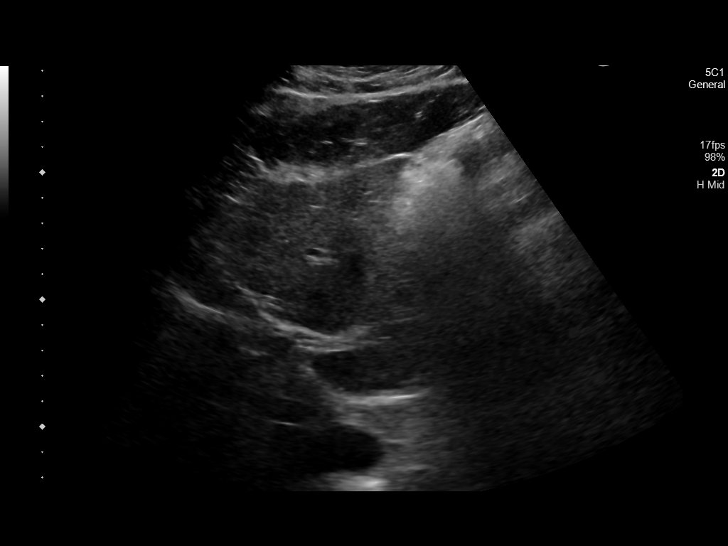
[im 24/32]
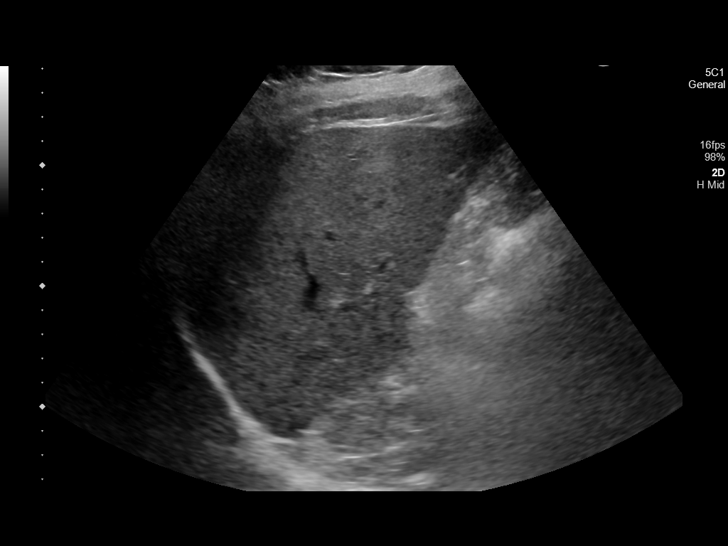
[im 26/32]
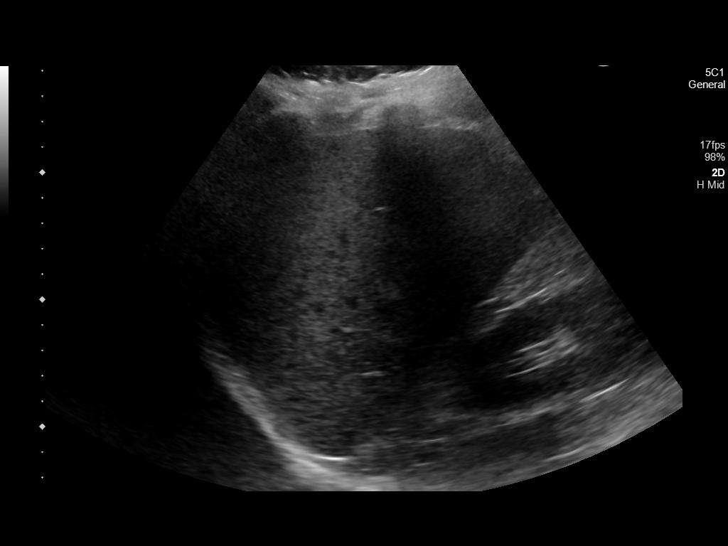
[im 29/32]
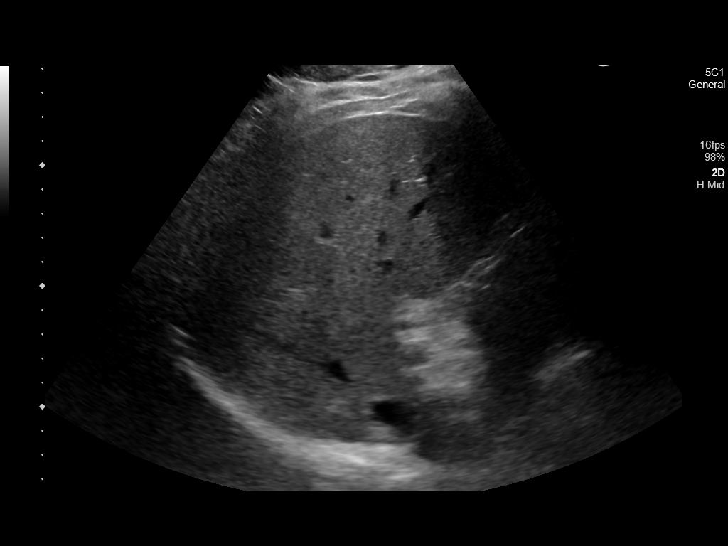
[im 32/32]
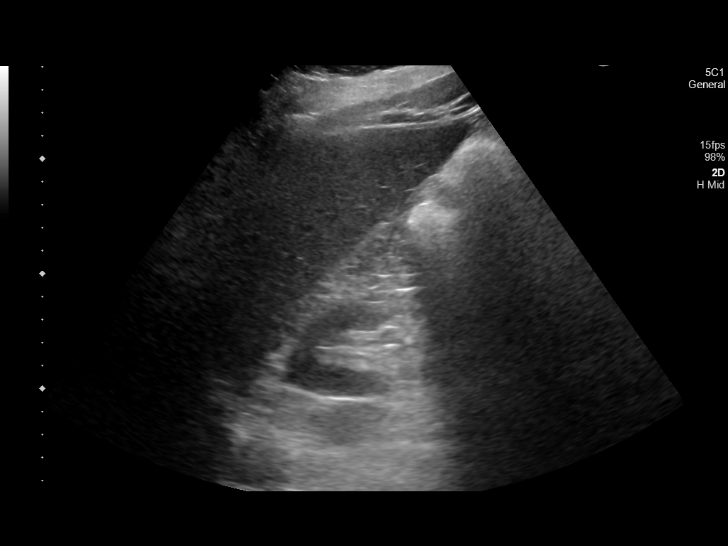

[14 of 25 positions shown; findings below may reference images not displayed]

FINDINGS: Gallbladder:

A 1.6 cm shadowing echogenic gallstone is seen within the neck of a
distended gallbladder. The gallbladder wall measures 4.6 mm in
thickness. No sonographic Murphy sign noted by sonographer.

Common bile duct:

Diameter: 6.2 mm

Liver:

No focal lesion identified. Within normal limits in parenchymal
echogenicity. Portal vein is patent on color Doppler imaging with
normal direction of blood flow towards the liver.

Other: None.
IMPRESSION: 1. Cholelithiasis and mild gallbladder wall thickening without
additional evidence to suggest acute cholecystitis. Further
evaluation with a nuclear medicine hepatobiliary scan is recommended
if this remains of clinical concern.

## 2023-02-28 ENCOUNTER — Ambulatory Visit: Payer: No Typology Code available for payment source | Admitting: Surgery

## 2023-04-04 ENCOUNTER — Ambulatory Visit: Payer: PRIVATE HEALTH INSURANCE | Admitting: Nurse Practitioner

## 2023-08-10 ENCOUNTER — Encounter: Payer: Self-pay | Admitting: Physician Assistant

## 2023-08-10 ENCOUNTER — Ambulatory Visit (INDEPENDENT_AMBULATORY_CARE_PROVIDER_SITE_OTHER): Payer: Commercial Managed Care - PPO | Admitting: Physician Assistant

## 2023-08-10 VITALS — BP 124/84 | HR 61 | Temp 98.0°F | Ht 70.0 in | Wt 258.0 lb

## 2023-08-10 DIAGNOSIS — Z1211 Encounter for screening for malignant neoplasm of colon: Secondary | ICD-10-CM

## 2023-08-10 DIAGNOSIS — Z1321 Encounter for screening for nutritional disorder: Secondary | ICD-10-CM

## 2023-08-10 DIAGNOSIS — Z125 Encounter for screening for malignant neoplasm of prostate: Secondary | ICD-10-CM

## 2023-08-10 DIAGNOSIS — E66812 Obesity, class 2: Secondary | ICD-10-CM | POA: Diagnosis not present

## 2023-08-10 DIAGNOSIS — Z0000 Encounter for general adult medical examination without abnormal findings: Secondary | ICD-10-CM | POA: Diagnosis not present

## 2023-08-10 DIAGNOSIS — Z6837 Body mass index (BMI) 37.0-37.9, adult: Secondary | ICD-10-CM

## 2023-08-10 DIAGNOSIS — Z23 Encounter for immunization: Secondary | ICD-10-CM | POA: Diagnosis not present

## 2023-08-10 DIAGNOSIS — Z13220 Encounter for screening for lipoid disorders: Secondary | ICD-10-CM | POA: Diagnosis not present

## 2023-08-10 DIAGNOSIS — E6609 Other obesity due to excess calories: Secondary | ICD-10-CM

## 2023-08-10 NOTE — Progress Notes (Signed)
Date:  08/10/2023   Name:  Bryan Obrien.   DOB:  Oct 19, 1970   MRN:  [id number]   Chief Complaint: Establish Care and Annual Exam  HPI Bryan Obrien is a very pleasant 52 year old male with history of obesity and skin cancer who presents new to the clinic today to establish care.  No specific complaints.  Would like routine labs and screenings.  Last Physical: >10y ago Last Dental Exam: this year, sees routinely  Last Eye Exam: earlier this year, wears contacts Last CRC screen: never  Last PSA: never  Hx skin cancer on face, removed ~1y ago. No lesions concerning to him right now.   Medication list has been reviewed and updated.  No outpatient medications have been marked as taking for the 08/10/23 encounter (Office Visit) with Remo Lipps, PA.      Review of Systems  Constitutional:  Negative for activity change, appetite change, fatigue and unexpected weight change.  HENT:  Negative for dental problem, hearing loss and trouble swallowing.   Eyes:  Negative for visual disturbance.  Respiratory:  Negative for cough, chest tightness, shortness of breath and wheezing.   Cardiovascular:  Negative for chest pain, palpitations and leg swelling.  Gastrointestinal:  Negative for abdominal distention, abdominal pain, blood in stool, constipation and diarrhea.  Endocrine: Negative for polydipsia, polyphagia and polyuria.  Genitourinary:  Negative for difficulty urinating, dysuria, enuresis, frequency, hematuria and testicular pain.  Musculoskeletal:  Negative for arthralgias, gait problem and joint swelling.  Skin:  Negative for rash and wound.  Neurological:  Negative for dizziness, syncope, weakness and headaches.  Psychiatric/Behavioral:  Negative for behavioral problems and sleep disturbance.     There are no problems to display for this patient.   No Known Allergies  Immunization History  Administered Date(s) Administered   Tdap 08/10/2023    Past Surgical History:   Procedure Laterality Date   brok nose     INGUINAL HERNIA REPAIR     left arm surgery      Social History   Tobacco Use   Smoking status: Never   Smokeless tobacco: Never  Vaping Use   Vaping status: Never Used  Substance Use Topics   Alcohol use: Not Currently   Drug use: Not Currently    Family History  Problem Relation Age of Onset   Hypertension Mother    Cancer Mother    Hypertension Sister    Cancer Sister    Stroke Brother    Hypertension Brother    Cancer Brother         08/10/2023    9:51 AM  GAD 7 : Generalized Anxiety Score  Nervous, Anxious, on Edge 0  Control/stop worrying 0  Worry too much - different things 0  Trouble relaxing 0  Restless 0  Easily annoyed or irritable 0  Afraid - awful might happen 0  Total GAD 7 Score 0  Anxiety Difficulty Not difficult at all       08/10/2023    9:51 AM  Depression screen PHQ 2/9  Decreased Interest 0  Down, Depressed, Hopeless 0  PHQ - 2 Score 0  Altered sleeping 0  Tired, decreased energy 0  Change in appetite 0  Feeling bad or failure about yourself  0  Trouble concentrating 0  Moving slowly or fidgety/restless 0  Suicidal thoughts 0  PHQ-9 Score 0  Difficult doing work/chores Not difficult at all    BP Readings from Last 3 Encounters:  08/10/23 124/84  01/20/21 134/79  01/07/21 129/84    Wt Readings from Last 3 Encounters:  08/10/23 258 lb (117 kg)  01/20/21 250 lb (113.4 kg)  01/07/21 256 lb 9.6 oz (116.4 kg)    BP 124/84   Pulse 61   Temp 98 F (36.7 C) (Oral)   Ht 5\' 10"  (1.778 m)   Wt 258 lb (117 kg)   SpO2 98%   BMI 37.02 kg/m   Physical Exam Vitals and nursing note reviewed.  Constitutional:      Appearance: Normal appearance.  HENT:     Ears:     Comments: EAC clear bilaterally with good view of TM which is without effusion or erythema.     Nose: Nose normal.     Mouth/Throat:     Mouth: Mucous membranes are moist. No oral lesions.     Dentition: Normal  dentition.     Pharynx: No posterior oropharyngeal erythema.  Eyes:     Extraocular Movements: Extraocular movements intact.     Conjunctiva/sclera: Conjunctivae normal.     Pupils: Pupils are equal, round, and reactive to light.  Neck:     Thyroid: No thyromegaly.  Cardiovascular:     Rate and Rhythm: Normal rate and regular rhythm.     Heart sounds: No murmur heard.    No friction rub. No gallop.     Comments: Pulses 2+ at radial, PT, DP bilaterally. No carotid bruit. No peripheral edema Pulmonary:     Effort: Pulmonary effort is normal.     Breath sounds: Normal breath sounds.  Abdominal:     General: Bowel sounds are normal.     Palpations: Abdomen is soft. There is no mass.     Tenderness: There is no abdominal tenderness.  Genitourinary:    Comments: Genital/rectal exam deferred. Musculoskeletal:     Comments: Full ROM with strength 5/5 bilateral upper and lower extremities  Feet:     Comments: Onychomycosis of 1st, 4th, 5th toenails bilaterally, R>L.  Lymphadenopathy:     Cervical: No cervical adenopathy.  Skin:    General: Skin is warm.     Capillary Refill: Capillary refill takes less than 2 seconds.     Findings: Lesion present. No rash.     Comments: Several small flesh-colored papules of the face mostly benign appearing though there is a small <62mm papule with central depression near the left temple which is somewhat peculiar.   Neurological:     Mental Status: He is alert and oriented to person, place, and time.     Gait: Gait is intact.  Psychiatric:        Mood and Affect: Mood normal.        Behavior: Behavior normal.     Recent Labs     Component Value Date/Time   NA 139 01/03/2021 2144   K 4.0 01/03/2021 2144   CL 101 01/03/2021 2144   CO2 29 01/03/2021 2144   GLUCOSE 129 (H) 01/03/2021 2144   BUN 16 01/03/2021 2144   CREATININE 0.88 01/03/2021 2144   CALCIUM 9.2 01/03/2021 2144   PROT 8.2 (H) 01/03/2021 2144   ALBUMIN 4.5 01/03/2021 2144   AST  24 01/03/2021 2144   ALT 19 01/03/2021 2144   ALKPHOS 45 01/03/2021 2144   BILITOT 0.8 01/03/2021 2144   GFRNONAA >60 01/03/2021 2144    Lab Results  Component Value Date   WBC 14.4 (H) 01/03/2021   HGB 17.3 (H) 01/03/2021   HCT 51.8 01/03/2021  MCV 85.2 01/03/2021   PLT 275 01/03/2021   No results found for: "HGBA1C" No results found for: "CHOL", "HDL", "LDLCALC", "LDLDIRECT", "TRIG", "CHOLHDL" No results found for: "TSH"   Assessment and Plan:  1. Annual physical exam Seemingly healthy patient with no abnormalities on exam. Encouraged healthy lifestyle including regular physical activity and consumption of whole fruits and vegetables. Encouraged routine dental and eye exams. 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  Checking baseline labs today.   - CBC with Differential/Platelet - Comprehensive metabolic panel - TSH - Lipid panel - PSA - VITAMIN D 25 Hydroxy (Vit-D Deficiency, Fractures)  2. Encounter for vitamin deficiency screening Patient requesting vitamin D testing as recommended to him by a family friend.  Increased risk with obesity, but otherwise no signs or symptoms of deficiency.  Patient advised he will probably need to pay out-of-pocket for this test around $70-$80 and he is okay with this expense and would like to proceed. - VITAMIN D 25 Hydroxy (Vit-D Deficiency, Fractures)  3. Class 2 obesity due to excess calories without serious comorbidity with body mass index (BMI) of 37.0 to 37.9 in adult Briefly discussed with patient today.  Will screen for comorbidities with today's labs.  He is fasting, to might consider add-on A1c if hyperglycemic on these labs.  Otherwise encouraged healthy lifestyle - CBC with Differential/Platelet - Comprehensive  metabolic panel - TSH - Lipid panel - VITAMIN D 25 Hydroxy (Vit-D Deficiency, Fractures)  4. Screening for hyperlipidemia - Lipid panel  5. Screening PSA (prostate specific antigen) - PSA  6. Need for Tdap vaccination Booster given today - Tdap vaccine greater than or equal to 7yo IM  7. Screening for colon cancer Patient declines colonoscopy but is willing to proceed with Cologuard.  Ordering today. - Cologuard   Return in about 1 year (around 08/09/2024) for CPE.    Bryan Morin, PA-C, DMSc, Nutritionist [facility] [phone]

## 2023-08-10 NOTE — Patient Instructions (Signed)

## 2023-08-11 ENCOUNTER — Encounter: Payer: Self-pay | Admitting: Physician Assistant

## 2023-08-11 DIAGNOSIS — E782 Mixed hyperlipidemia: Secondary | ICD-10-CM | POA: Insufficient documentation

## 2023-08-11 DIAGNOSIS — E559 Vitamin D deficiency, unspecified: Secondary | ICD-10-CM | POA: Insufficient documentation

## 2023-08-11 LAB — COMPREHENSIVE METABOLIC PANEL
ALT: 16 [IU]/L (ref 0–44)
AST: 23 [IU]/L (ref 0–40)
Albumin: 4.6 g/dL (ref 3.8–4.9)
Alkaline Phosphatase: 69 [IU]/L (ref 44–121)
BUN/Creatinine Ratio: 18 (ref 9–20)
BUN: 18 mg/dL (ref 6–24)
Bilirubin Total: 0.3 mg/dL (ref 0.0–1.2)
CO2: 21 mmol/L (ref 20–29)
Calcium: 9.2 mg/dL (ref 8.7–10.2)
Chloride: 103 mmol/L (ref 96–106)
Creatinine, Ser: 1.02 mg/dL (ref 0.76–1.27)
Globulin, Total: 2.9 g/dL (ref 1.5–4.5)
Glucose: 98 mg/dL (ref 70–99)
Potassium: 4 mmol/L (ref 3.5–5.2)
Sodium: 143 mmol/L (ref 134–144)
Total Protein: 7.5 g/dL (ref 6.0–8.5)
eGFR: 87 mL/min/{1.73_m2} (ref >59)

## 2023-08-11 LAB — LIPID PANEL
Chol/HDL Ratio: 5.9 {ratio} — ABNORMAL HIGH (ref 0.0–5.0)
Cholesterol, Total: 212 mg/dL — ABNORMAL HIGH (ref 100–199)
HDL: 36 mg/dL — ABNORMAL LOW (ref >39)
LDL Chol Calc (NIH): 144 mg/dL — ABNORMAL HIGH (ref 0–99)
Triglycerides: 176 mg/dL — ABNORMAL HIGH (ref 0–149)
VLDL Cholesterol Cal: 32 mg/dL (ref 5–40)

## 2023-08-11 LAB — PSA: Prostate Specific Ag, Serum: 0.7 ng/mL (ref 0.0–4.0)

## 2023-08-11 LAB — CBC WITH DIFFERENTIAL/PLATELET
Basophils Absolute: 0.1 10*3/uL (ref 0.0–0.2)
Basos: 1 %
EOS (ABSOLUTE): 0.2 10*3/uL (ref 0.0–0.4)
Eos: 2 %
Hematocrit: 50 % (ref 37.5–51.0)
Hemoglobin: 16.4 g/dL (ref 13.0–17.7)
Immature Grans (Abs): 0 10*3/uL (ref 0.0–0.1)
Immature Granulocytes: 0 %
Lymphocytes Absolute: 2.4 10*3/uL (ref 0.7–3.1)
Lymphs: 34 %
MCH: 28.6 pg (ref 26.6–33.0)
MCHC: 32.8 g/dL (ref 31.5–35.7)
MCV: 87 fL (ref 79–97)
Monocytes Absolute: 0.6 10*3/uL (ref 0.1–0.9)
Monocytes: 8 %
Neutrophils Absolute: 3.8 10*3/uL (ref 1.4–7.0)
Neutrophils: 55 %
Platelets: 262 10*3/uL (ref 150–450)
RBC: 5.74 x10E6/uL (ref 4.14–5.80)
RDW: 13.1 % (ref 11.6–15.4)
WBC: 7.1 10*3/uL (ref 3.4–10.8)

## 2023-08-11 LAB — TSH: TSH: 2.7 u[IU]/mL (ref 0.450–4.500)

## 2023-08-11 LAB — VITAMIN D 25 HYDROXY (VIT D DEFICIENCY, FRACTURES): Vit D, 25-Hydroxy: 23 ng/mL — ABNORMAL LOW (ref 30.0–100.0)

## 2023-09-03 LAB — COLOGUARD: COLOGUARD: NEGATIVE

## 2024-08-14 ENCOUNTER — Ambulatory Visit: Payer: Self-pay | Admitting: Physician Assistant

## 2024-08-14 ENCOUNTER — Encounter: Payer: Self-pay | Admitting: Physician Assistant

## 2024-08-14 VITALS — BP 134/82 | HR 68 | Temp 98.2°F | Ht 70.0 in | Wt 247.0 lb

## 2024-08-14 DIAGNOSIS — E782 Mixed hyperlipidemia: Secondary | ICD-10-CM

## 2024-08-14 DIAGNOSIS — E66812 Obesity, class 2: Secondary | ICD-10-CM

## 2024-08-14 DIAGNOSIS — E6609 Other obesity due to excess calories: Secondary | ICD-10-CM

## 2024-08-14 DIAGNOSIS — Z125 Encounter for screening for malignant neoplasm of prostate: Secondary | ICD-10-CM

## 2024-08-14 DIAGNOSIS — E559 Vitamin D deficiency, unspecified: Secondary | ICD-10-CM

## 2024-08-14 DIAGNOSIS — Z0000 Encounter for general adult medical examination without abnormal findings: Secondary | ICD-10-CM

## 2024-08-14 DIAGNOSIS — Z6837 Body mass index (BMI) 37.0-37.9, adult: Secondary | ICD-10-CM

## 2024-08-14 DIAGNOSIS — Z2821 Immunization not carried out because of patient refusal: Secondary | ICD-10-CM

## 2024-08-14 DIAGNOSIS — I10 Essential (primary) hypertension: Secondary | ICD-10-CM | POA: Insufficient documentation

## 2024-08-14 NOTE — Patient Instructions (Signed)
 Reviewed the Seven S's of hypertension including soil (encouraged plant-forward whole food eating habits), salt (intake <2g), smoking, stimulants (e.g. caffeine), stress, sleep (quantity, quality, timing, snoring/OSA), and sedentary lifestyle (aim for 150 active minutes per week).

## 2024-08-14 NOTE — Progress Notes (Signed)
 Date:  08/14/2024   Name:  Bryan Obrien.   DOB:  07/18/1971   MRN:  [id number]   Chief Complaint: Annual Exam  HPI  Bryan Obrien returns for routine physical with no acute complaints. He has been working to improve lifestyle over the last year avoiding processed foods and walking for exercise. According to our scale he has lost 11 lb and intends to lose more.   Last Physical: 08/10/23 Last Dental Exam: June 2025 Last Eye Exam: Sept 2025, wears contacts Last CRC screen: Cologuard neg 08/29/23 Last PSA: 08/10/23, was 0.7  Immunization Due: Shingrix and Prevnar 20  Medication list has been reviewed and updated.  Active Medications[1]   Review of Systems  Patient Active Problem List   Diagnosis Date Noted   Mild hypertension 08/14/2024   Mixed hyperlipidemia 08/11/2023   Vitamin D  insufficiency 08/11/2023   Class 2 obesity due to excess calories without serious comorbidity with body mass index (BMI) of 37.0 to 37.9 in adult 08/10/2023    Allergies[2]  Immunization History  Administered Date(s) Administered   Tdap 08/10/2023    Past Surgical History:  Procedure Laterality Date   brok nose     CHOLECYSTECTOMY     FRACTURE SURGERY     INGUINAL HERNIA REPAIR     left arm surgery      Social History[3]  Family History  Problem Relation Age of Onset   Hypertension Mother    Cancer Mother    Hypertension Sister    Cancer Sister    Stroke Brother    Hypertension Brother    Cancer Brother         08/14/2024    8:07 AM 08/10/2023    9:51 AM  GAD 7 : Generalized Anxiety Score  Nervous, Anxious, on Edge 0 0  Control/stop worrying 0 0  Worry too much - different things 0 0  Trouble relaxing 0 0  Restless 0 0  Easily annoyed or irritable 0 0  Afraid - awful might happen 0 0  Total GAD 7 Score 0 0  Anxiety Difficulty Not difficult at all Not difficult at all       08/14/2024    8:07 AM 08/10/2023    9:51 AM  Depression screen PHQ 2/9  Decreased  Interest 0 0  Down, Depressed, Hopeless 0 0  PHQ - 2 Score 0 0  Altered sleeping  0  Tired, decreased energy  0  Change in appetite  0  Feeling bad or failure about yourself   0  Trouble concentrating  0  Moving slowly or fidgety/restless  0  Suicidal thoughts  0  PHQ-9 Score  0   Difficult doing work/chores  Not difficult at all     Data saved with a previous flowsheet row definition    BP Readings from Last 3 Encounters:  08/14/24 134/82  08/10/23 124/84  01/20/21 134/79    Wt Readings from Last 3 Encounters:  08/14/24 247 lb (112 kg)  08/10/23 258 lb (117 kg)  01/20/21 250 lb (113.4 kg)    BP 134/82 (Cuff Size: Large)   Pulse 68   Temp 98.2 F (36.8 C)   Ht 5' 10 (1.778 m)   Wt 247 lb (112 kg)   SpO2 97%   BMI 35.44 kg/m   Physical Exam Vitals and nursing note reviewed.  Constitutional:      Appearance: Normal appearance.  HENT:     Ears:     Comments: EAC clear  bilaterally with good view of TM which is without effusion or erythema.     Nose: Nose normal.     Mouth/Throat:     Mouth: Mucous membranes are moist. No oral lesions.     Dentition: Normal dentition.     Pharynx: No posterior oropharyngeal erythema.  Eyes:     Extraocular Movements: Extraocular movements intact.     Conjunctiva/sclera: Conjunctivae normal.     Pupils: Pupils are equal, round, and reactive to light.  Neck:     Thyroid: No thyromegaly.  Cardiovascular:     Rate and Rhythm: Normal rate and regular rhythm.     Heart sounds: No murmur heard.    No friction rub. No gallop.     Comments: Pulses 2+ at radial, PT, DP bilaterally. No carotid bruit. No peripheral edema Pulmonary:     Effort: Pulmonary effort is normal.     Breath sounds: Normal breath sounds.  Abdominal:     General: Bowel sounds are normal.     Palpations: Abdomen is soft. There is no mass.     Tenderness: There is no abdominal tenderness.  Genitourinary:    Comments: Genital/rectal exam deferred. Reviewed  technique for testicular self-exam. Musculoskeletal:     Comments: Full ROM with strength 5/5 bilateral upper and lower extremities  Lymphadenopathy:     Cervical: No cervical adenopathy.  Skin:    General: Skin is warm.     Capillary Refill: Capillary refill takes less than 2 seconds.     Findings: No lesion or rash.  Neurological:     Mental Status: He is alert and oriented to person, place, and time.     Gait: Gait is intact.  Psychiatric:        Mood and Affect: Mood normal.        Behavior: Behavior normal.     Recent Labs     Component Value Date/Time   NA 143 08/10/2023 1111   K 4.0 08/10/2023 1111   CL 103 08/10/2023 1111   CO2 21 08/10/2023 1111   GLUCOSE 98 08/10/2023 1111   GLUCOSE 129 (H) 01/03/2021 2144   BUN 18 08/10/2023 1111   CREATININE 1.02 08/10/2023 1111   CALCIUM 9.2 08/10/2023 1111   PROT 7.5 08/10/2023 1111   ALBUMIN 4.6 08/10/2023 1111   AST 23 08/10/2023 1111   ALT 16 08/10/2023 1111   ALKPHOS 69 08/10/2023 1111   BILITOT 0.3 08/10/2023 1111   GFRNONAA >60 01/03/2021 2144    Lab Results  Component Value Date   WBC 7.1 08/10/2023   HGB 16.4 08/10/2023   HCT 50.0 08/10/2023   MCV 87 08/10/2023   PLT 262 08/10/2023   No results found for: HGBA1C Lab Results  Component Value Date   CHOL 212 (H) 08/10/2023   HDL 36 (L) 08/10/2023   LDLCALC 144 (H) 08/10/2023   TRIG 176 (H) 08/10/2023   CHOLHDL 5.9 (H) 08/10/2023   Lab Results  Component Value Date   TSH 2.700 08/10/2023      Assessment and Plan:  1. Annual physical exam (Primary) Encouraged healthy lifestyle including regular physical activity and consumption of whole fruits and vegetables. Encouraged routine dental and eye exams.   - CBC with Differential/Platelet - Comprehensive metabolic panel with GFR - TSH - Lipid panel - Hemoglobin A1c - PSA  2. Mixed hyperlipidemia Check fasting lipids today  - Lipid panel  3. Mild hypertension Focus on lifestyle measures for  now. Reviewed the Seven S's of hypertension  including soil (encouraged plant-forward whole food eating habits), salt (intake <2g), smoking, stimulants (e.g. caffeine), stress, sleep (quantity, quality, timing, snoring/OSA), and sedentary lifestyle (aim for 150 active minutes per week).    4. Class 2 obesity due to excess calories without serious comorbidity with body mass index (BMI) of 37.0 to 37.9 in adult Congratulated on weight loss so far. Encouraged to continue healthy lifestyle. Check metabolic labs.   - CBC with Differential/Platelet - Comprehensive metabolic panel with GFR - TSH - Lipid panel - Hemoglobin A1c - VITAMIN D  25 Hydroxy (Vit-D Deficiency, Fractures)  5. Vitamin D  deficiency - VITAMIN D  25 Hydroxy (Vit-D Deficiency, Fractures)  6. Screening PSA (prostate specific antigen) - PSA  7. Immunization declined Reviewed due for Prevnar 20 and Shingrix. He declines today but will consider for future.     Patient also mentions some mild onychomycosis though this was not evaluated today. He was offered treatment with oral terbinafine but he would like to continue OTC topical for now.    F/u 1y CPE   Rolan Hoyle, PA-C, DMSc, Nutritionist [facility] [phone]      [1]  No outpatient medications have been marked as taking for the 08/14/24 encounter (Office Visit) with Hoyle Toribio SQUIBB, PA.  [2] No Known Allergies [3]  Social History Tobacco Use   Smoking status: Never   Smokeless tobacco: Never  Vaping Use   Vaping status: Never Used  Substance Use Topics   Alcohol use: Never   Drug use: Never

## 2025-08-15 ENCOUNTER — Encounter: Admitting: Physician Assistant
